# Patient Record
Sex: Male | Born: 1985 | Race: White | Hispanic: No | Marital: Married | Smoking: Current every day smoker
Health system: Southern US, Community
[De-identification: ages and names within clinical notes are randomized; demographics above are authoritative.]

---
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
## 2010-04-27 ENCOUNTER — Emergency Department (HOSPITAL_COMMUNITY): Admission: EM | Admit: 2010-04-27 | Discharge: 2010-04-27 | Payer: Self-pay | Admitting: Emergency Medicine

## 2011-01-05 NOTE — Discharge Summary (Signed)
Logan Lane, Logan Lane             ACCOUNT NO.:  [id number]   MEDICAL RECORD NO.:  [id number]          PATIENT TYPE:  INP   LOCATION:  6741                         FACILITY:  [facility]   PHYSICIAN:  Ileana Roup, M.D.  DATE OF BIRTH:  May 20, 1986   DATE OF ADMISSION:  01/01/2006  DATE OF DISCHARGE:  01/05/2006                                 DISCHARGE SUMMARY   DISCHARGE DIAGNOSES:  1.  Left lower lobe pneumonia with early sepsis.  2.  History of premature birth.  3.  Status post tonsillectomy and adenoidectomy.   DISCHARGE MEDICATIONS:  1.  Levaquin 750 mg p.o. daily last dose to be taken on Jan 14, 2006 for a      total treatment course of 2 weeks.  2.  Vicodin 5/500 one tablet p.o. q.4 h p.r.n. pleuritic chest pain.   CONDITION AT DISCHARGE:  The patient is stable at the time of discharge.  He  is without complaints of shortness of breath.  His oxygen saturations are  100% on room air.  He denies any continued pleuritic chest pain.  He is  afebrile.   FOLLOW-UP PLANS INCLUDE:  He will follow up with Dr. Orma Flaming in the  outpatient clinic next Friday, Jan 11, 2006.   PROCEDURES:  1.  His chest x-ray done on Jan 01, 2006 shows a left lower lobe pneumonia.  2.  CT of the abdomen and pelvis and chest done Jan 02, 2006 shows left      lower lobe pneumonia, no evidence for aortic dissection or pulmonary      emboli, fullness in the subcarinal region of the right hilum suspicious      for adenopathy.  There is no pathologic findings in the abdomen.  3.  Punch biopsy of the right anterior foot done on Jan 01, 2006 shows mild      orthokeratosis and mild epidermal hyperplasia.  There are occasional      neutrophils.  Impression:  Blue stain is negative for iron and the [facility] spotted fever immunoperoxidase stain is negative.  4.  Chest x-ray done on Jan 03, 2006 shows near complete clearing of the      left lower lobe infiltrate.  5.  Lumbar puncture done on Jan 01, 2006  shows a protein count of 28,      glucose of 75, no organisms seen on Gram stain.  White blood cell count      was 1 and red blood cell count was 221 in tube #1.  White blood cell      count was 3 and red blood cell count was 16 in tube #4.   CONSULTATIONS:  There are no consultations during this admission.   BRIEF ADMISSION HISTORY AND PHYSICAL:  Logan Lane is an 25 year old  previously healthy white male with no significant past medical history.  He  presents with 1-day history of fevers to 101, headache, nausea, vomiting,  cough productive of green sputum and near syncope on the morning of  admission.  He had a prodrome of a bad cold for  the past month.  He did have  tick exposure about 1 week prior to admission as he was de-ticking a stray  dog manually.  He also has complaints of pleuritic chest pain.  Significant  findings on admission exam include a temperature of 101.7, blood pressure  127/74, pulse 92, respirations 22, he is 98% on room air.  In general, he is  a mild distress and slightly diaphoretic.  His neck was mildly stiff but  supple just hours later.  Cardiovascular exam showed a regular rate and  rhythm, no murmurs.  The lungs had decreased breath sounds in the left base.  His skin did have one area of papular erythematous rash on the left and  right anterior foot.  Cranial nerves were intact and strength was 5/5 in  bilateral upper and lower extremities.  Admission labs of significance were  a white blood cell count of 21.6 with an ANC of 19.9.  His ABG on admission  was 7.408 for a pH with a pCO2 of 41.6 and a pO2 of 85.0, bicarb of 26.1,  oxygen saturation of 96%.  His UDS was negative.  His UA was negative except  for ketones.  Point-care-markers were negative.  His respiratory culture did  show gram-positive cocci in clusters.  His strep pneumo antigen was  negative.  His coagulopathies were normal.  Please see admission H&P for  full details.   HOSPITAL COURSE  BY PROBLEM:  Problem 1.  LEFT LOWER LOBE PNEUMONIA.  Mr.  Lane came in with complaints of a prodrome of a month of a bad cold that  progressed to a productive cough, high fevers, and near syncope.  His  initial chest x-ray did show a left lower lobe pneumonia.  He did have  leukocytosis on admission and left-sided pleuritic chest pain all consistent  with a severe left lower lobe pneumonia.  He was immediately began on  vancomycin and Zosyn.  Again, his oxygen saturations were normal at the time  of admission.  He quickly began to defervesce within the first doses of his  antibiotics.  His course was complicated by early sepsis.  Please see below.  He did continue to improve on IV vancomycin and Zosyn.  His respiratory  cultures eventually grew Streptococcus  pneumoniae and this was sensitive to  levofloxacin.  And therefore, he was changed to p.o. antibiotics.  He was  afebrile for greater than 48 hours prior to discharge.  His white blood cell  count did return to normal.  His pleuritic chest pain did resolve.  Again on  the day of discharge his oxygen saturations are 98% on room air.  His last  white blood cell count is 7.2 on Jan 04, 2006.  He is breathing easily and a  repeat chest x-ray did show near complete resolution of his left lower lobe  pneumonia.  He is felt stable for discharge and is being discharged on  Levaquin for a total treatment course of 2 weeks given his severity of his  pneumonia.  He is also being given Vicodin for any further pleuritic chest  pain.  Of note many other diagnoses were considered at the time of his  admission.  He did have tick exposure and therefore he was started on  doxycycline.  A punch biopsy of erythematous papular rash on his foot was  negative for [facility] spotted fever.  He also had headache at the time  of admission with high fevers and questionable neck  stiffness and a lumbar puncture was performed.  The results were completely  normal.  Protein and  glucose levels were normal and the white blood cell count was normal.  This  was not felt to be a meningitis.  Of note, he did have a reaction to the  vancomycin consistent with red man syndrome.  Secondary to his need for this  antibiotic the vancomycin was continued but run at a much slower rate and he  was pretreated with Benadryl prior to each treatment.  He had no further  symptoms of red man syndrome with subsequent vancomycin doses run slowly  pretreated with Benadryl.   Problem 2. EARLY SEPSIS.  His blood cultures drawn at the time of admission  one of the two did show gram-positive cocci in clusters later identified to  be strep pneumo.  Again, he was treated with vancomycin and Zosyn until  these cultures and identities were back.  He did become hypotensive on the  night of admission to systolic's in the 80s.  His blood pressure did respond  to IV fluid boluses.  His CVP was followed closely and he was treated with  IV fluids.  An ACTH stim test was performed and did show a bump in his  cortisol level from 7.7 to 24.  However the test was not performed entirely  probably as the post ACTH administration cortisol was drawn 2-1/2 hours  after administration of ACTH.  Although this was deemed to be a positive  test his blood pressure was stable for 24 hours before the results were  received and it was not felt that he needed any stress dose steroids.  He  had no further episodes of hypotension through the rest of his  hospitalization.  On the day of discharge his blood pressure was actually  143/81.  Again, he will be discharged on Levaquin for a total course of 2  weeks given the severity of his illness.   Problem 3. TOOTH PAIN.  On the day of discharge the patient admitted to some  left jaw/upper tooth pain.  He said he felt that it may be related to some  wisdom teeth.  Examination did not reveal any evidence of abscess or dental  caries.  Again, he is  afebrile and his white blood cell count was normal.  He was advised to see a dentist for possible Panorex films.  Levaquin should  cover any sinusitis component of this pain.  He was advised that if he were  to have any fevers or continued severe pain in this area he should call  immediately.  Although it would be advisable for him to see a dentist soon  regarding this, I doubt any intervention would be done while he is  recovering from the severe pneumonia.   FOLLOW UP:  He will follow with me in 1 week.  Until that time he should  stay out of work and increase his activity slowly.   DISCHARGE LABORATORIES AND VITALS:  Vital signs on the day of discharge  include a temperature of 97.1, blood pressure 143/81, pulse of 67,  respirations 20, 98% on room air.  His last set of labs include a white  blood cell count of 7.2, hemoglobin 14.1, platelets of 239.  His sodium was 141, potassium 3.9, chloride 106, bicarb 30, BUN 3, creatinine 0.9, glucose  of 94, calcium 8.9.  Other significant labs include a lumbar puncture with  protein of 28, glucose of 75, tube #1  white blood cells 1 and red blood  cells 221, tube #4 white blood cells 3 and red blood cells 16.  HIV was  nonreactive.  Respiratory cultures for strep pneumo sensitive to Levaquin.  Blood cultures one of two grew strep pneumo sensitive to Levaquin.  His CSF  culture showed no growth.  His random cortisol was 7.3.  His post ACTH stem  cortisol at 2-1/2 hours was 23.6 and at 3 hours was 30.9.  Pending labs at  the time of this dictation include enterovirus and HSV PCR on his CSF.  Final readings on his CSF culture and blood cultures.      Inis Sizer, M.D.    ______________________________  Ileana Roup, M.D.    DC/MEDQ  D:  01/05/2006  T:  01/05/2006  Job:  [id number]   cc:   Outpatient Clinic

## 2011-08-25 ENCOUNTER — Emergency Department (HOSPITAL_COMMUNITY)
Admission: EM | Admit: 2011-08-25 | Discharge: 2011-08-25 | Disposition: A | Discharge status: Home / Self Care | Payer: 59 | Attending: Emergency Medicine | Admitting: Emergency Medicine

## 2011-08-25 ENCOUNTER — Emergency Department (HOSPITAL_COMMUNITY): Payer: 59

## 2011-08-25 ENCOUNTER — Encounter: Payer: Self-pay | Admitting: *Deleted

## 2011-08-25 DIAGNOSIS — Y990 Civilian activity done for income or pay: Secondary | ICD-10-CM | POA: Insufficient documentation

## 2011-08-25 DIAGNOSIS — M79609 Pain in unspecified limb: Secondary | ICD-10-CM | POA: Insufficient documentation

## 2011-08-25 DIAGNOSIS — IMO0002 Reserved for concepts with insufficient information to code with codable children: Secondary | ICD-10-CM | POA: Insufficient documentation

## 2011-08-25 DIAGNOSIS — R209 Unspecified disturbances of skin sensation: Secondary | ICD-10-CM | POA: Insufficient documentation

## 2011-08-25 DIAGNOSIS — S62329A Displaced fracture of shaft of unspecified metacarpal bone, initial encounter for closed fracture: Secondary | ICD-10-CM | POA: Insufficient documentation

## 2011-08-25 DIAGNOSIS — S6990XA Unspecified injury of unspecified wrist, hand and finger(s), initial encounter: Secondary | ICD-10-CM | POA: Insufficient documentation

## 2011-08-25 DIAGNOSIS — M7989 Other specified soft tissue disorders: Secondary | ICD-10-CM | POA: Insufficient documentation

## 2011-08-25 DIAGNOSIS — R29898 Other symptoms and signs involving the musculoskeletal system: Secondary | ICD-10-CM | POA: Insufficient documentation

## 2011-08-25 MED ORDER — HYDROCODONE-ACETAMINOPHEN 5-325 MG PO TABS: 2.0000 | ORAL_TABLET | ORAL | Status: AC | PRN

## 2011-08-25 NOTE — ED Provider Notes (Signed)
History     CSN: [id number]  Arrival date & time 08/25/11  1505   First MD Initiated Contact with Patient 08/25/11 1847      Chief Complaint  Patient presents with  . 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 He has no wrist injury and no pain to the rest of his right arm. This localized sharp severe tender pain without radiation or associated symptoms other than the tingling diffusely to the hand with no severe swelling. He is able to flex and extend all digits of his right hand with nearly full extension but not quite full flexion due to the pain at the fourth metacarpal. History reviewed. No pertinent past medical history.  History reviewed. No pertinent past surgical history.  History reviewed. No pertinent family history.  History  Substance Use Topics  . Smoking status: Current Everyday Smoker -- 0.5 packs/day    Types: Cigarettes  . Smokeless tobacco: Not on file  . Alcohol Use: Yes      Review of Systems  Constitutional: Negative for fever.       10 Systems reviewed and are negative for acute change except as noted in the HPI.  HENT: Negative for congestion.   Eyes: Negative for discharge and redness.  Respiratory: Negative for cough and shortness of breath.   Cardiovascular: Negative for chest pain.  Gastrointestinal: Negative for vomiting and abdominal  pain.  Musculoskeletal: Negative for back pain.  Skin: Negative for rash.  Neurological: Negative for syncope, numbness and headaches.  Psychiatric/Behavioral:       No behavior change.    Allergies  Review of patient's allergies indicates no known allergies.  Home Medications   Current Outpatient Rx  Name Route Sig Dispense Refill  . HYDROCODONE-ACETAMINOPHEN 5-325 MG PO TABS Oral Take 2 tablets by mouth every 4 (four) hours as needed for pain. 20 tablet 0    BP 119/71  Pulse 62  Temp(Src) 98.2 F (36.8 C) (Oral)  Resp 18  SpO2 97%  Physical Exam  Nursing note and vitals reviewed. Constitutional:       Awake, alert, nontoxic appearance.  HENT:  Head: Atraumatic.  Eyes: Right eye exhibits no discharge. Left eye exhibits no discharge.  Neck: Neck supple.  Pulmonary/Chest: Effort normal. He exhibits no tenderness.  Abdominal: Soft. There is no tenderness. There is no rebound.  Musculoskeletal: He exhibits tenderness.       Baseline ROM, no obvious new focal weakness.  Left arm and both legs are nontender the right arm is nontender the shoulder elbow and wrist including the snuff box. The patient has isolated tenderness to the right hand over the fourth metacarpal region only. There is no obvious rotational defect to his hand he has capillary refill less than 2 seconds in all digits of the right hand, he is normal light touch in all digits to the right hand, he has nearly full extension  of all digits but decreased flexion of all digits due to the pain with mild swelling and moderate tenderness over the fourth metacarpal region only with the first second third and fifth metacarpals nontender to direct palpation as well as axial load. The skin is intact the right hand. I do not clinically suspect compartment syndrome. He is able to flex and extend the ring finger just not to full range of motion.  Neurological:       Mental status and motor strength appears baseline for patient and  situation.  Skin: No rash noted.  Psychiatric: He has a normal mood and affect.    ED Course  Procedures (including critical care time) Clinically I suspect a fracture to the fourth metacarpal shaft only. Labs Reviewed - No data to display Dg Hand Complete Right  08/25/2011  *RADIOLOGY REPORT*  Clinical Data: Left hand pain/injury  RIGHT HAND - COMPLETE 3+ VIEW  Comparison: None.  Findings: Nondisplaced fracture of the fourth mid metacarpal shaft.  The joint spaces are preserved.  Mild dorsal soft tissue swelling.  IMPRESSION: Nondisplaced fracture of the fourth mid metacarpal shaft.  Original Report Authenticated By: Charline Bills, M.D.     1. Fracture, metacarpal shaft       MDM          Hurman Horn, MD 08/25/11 2300

## 2011-08-25 NOTE — Progress Notes (Signed)
Orthopedic Tech Progress Note Patient Details:  Logan Lane [birth year] [id number]  Type of Splint: Other (comment) (ulna gutter) Splint Location: right hand Splint Interventions: Application    Nikki Dom 08/25/2011, 7:22 PM

## 2011-08-25 NOTE — ED Notes (Signed)
Patient with right hand injury from yesterday.  Right hans is slightly swollen and painful.  CMS intact, right index states unable to feel

## 2013-07-20 ENCOUNTER — Emergency Department (HOSPITAL_COMMUNITY): Payer: 59

## 2013-07-20 ENCOUNTER — Encounter (HOSPITAL_COMMUNITY): Payer: Self-pay | Admitting: Emergency Medicine

## 2013-07-20 DIAGNOSIS — S92919A Unspecified fracture of unspecified toe(s), initial encounter for closed fracture: Secondary | ICD-10-CM | POA: Insufficient documentation

## 2013-07-20 DIAGNOSIS — X58XXXA Exposure to other specified factors, initial encounter: Secondary | ICD-10-CM | POA: Insufficient documentation

## 2013-07-20 DIAGNOSIS — Y939 Activity, unspecified: Secondary | ICD-10-CM | POA: Insufficient documentation

## 2013-07-20 DIAGNOSIS — F17200 Nicotine dependence, unspecified, uncomplicated: Secondary | ICD-10-CM | POA: Insufficient documentation

## 2013-07-20 DIAGNOSIS — Y929 Unspecified place or not applicable: Secondary | ICD-10-CM | POA: Insufficient documentation

## 2013-07-20 NOTE — ED Notes (Signed)
Pt c/o rt foot pain, but denies any injury.

## 2013-07-21 ENCOUNTER — Emergency Department (HOSPITAL_COMMUNITY)
Admission: EM | Admit: 2013-07-21 | Discharge: 2013-07-21 | Disposition: A | Discharge status: Home / Self Care | Payer: 59 | Attending: Emergency Medicine | Admitting: Emergency Medicine

## 2013-07-21 DIAGNOSIS — S92411A Displaced fracture of proximal phalanx of right great toe, initial encounter for closed fracture: Secondary | ICD-10-CM

## 2013-07-21 MED ORDER — IBUPROFEN 800 MG PO TABS
800.0000 mg | ORAL_TABLET | Freq: Once | ORAL | Status: AC
  Administered 2013-07-21: 800 mg via ORAL
  Filled 2013-07-21: qty 1

## 2013-07-21 MED ORDER — OXYCODONE-ACETAMINOPHEN 5-325 MG PO TABS: 1.0000 | ORAL_TABLET | ORAL | Status: AC | PRN

## 2013-07-21 NOTE — ED Provider Notes (Signed)
CSN: [id number]     Arrival date & time 07/20/13  2310 History   First MD Initiated Contact with Patient 07/21/13 0243     Chief Complaint  Patient presents with  . Foot Pain   (Consider location/radiation/quality/duration/timing/severity/associated sxs/prior Treatment) Patient is a 27 y.o. male presenting with lower extremity pain. The history is provided by the patient and the spouse.  Foot Pain  He noticed onset this evening of pain and swelling of the right first toe. Pain is moderate to severe and he rates it at 8/10. It is worse with palpation and with ambulation. He denies any history of trauma. However, his wife states that he dropped a total bottom of his foot last night. Nothing makes it any better. He has not taken any medication.  History reviewed. No pertinent past medical history. History reviewed. No pertinent past surgical history. No family history on file. History  Substance Use Topics  . Smoking status: Current Every Day Smoker -- 0.50 packs/day    Types: Cigarettes  . Smokeless tobacco: Not on file  . Alcohol Use: Yes    Review of Systems  All other systems reviewed and are negative.    Allergies  Review of patient's allergies indicates no known allergies.  Home Medications   Current Outpatient Rx  Name  Route  Sig  Dispense  Refill  . oxyCODONE-acetaminophen (PERCOCET/ROXICET) 5-325 MG per tablet   Oral   Take 1 tablet by mouth every 4 (four) hours as needed for severe pain.   20 tablet   0    BP 136/74  Pulse 70  Temp(Src) 98.3 F (36.8 C) (Oral)  Resp 24  Ht 6' (1.829 m)  Wt 199 lb (90.266 kg)  BMI 26.98 kg/m2  SpO2 100% Physical Exam  Nursing note and vitals reviewed.  27 year old male, resting comfortably and in no acute distress. Vital signs are significant for tachypnea with respiratory rate of 24. Oxygen saturation is 100%, which is normal. Head is normocephalic and atraumatic. PERRLA, EOMI. Oropharynx is clear. Neck is nontender  and supple without adenopathy or JVD. Back is nontender and there is no CVA tenderness. Lungs are clear without rales, wheezes, or rhonchi. Chest is nontender. Heart has regular rate and rhythm without murmur. Abdomen is soft, flat, nontender without masses or hepatosplenomegaly and peristalsis is normoactive. Extremities:  There is soft tissue swelling and ecchymosis of the proximal phalanx of the right first toe. There is moderate tenderness palpation. Distal neurovascular exam is intact with normal sensation and prompt capillary refill. Remainder of extremity exam is normal. Skin is warm and dry without rash. Neurologic: Mental status is normal, cranial nerves are intact, there are no motor or sensory deficits.  ED Course  Procedures (including critical care time) Imaging Review Dg Foot Complete Right  07/20/2013   CLINICAL DATA:  Right foot pain after working in yd involving great toe and 2nd toe, patient denies injury  EXAM: RIGHT FOOT COMPLETE - 3+ VIEW  COMPARISON:  None.  FINDINGS: There is a mildly displaced oblique fracture involving the 1st proximal phalanx. Fracture line does not appear to extend into the metatarsophalangeal joint or the interphalangeal joint. No fractures identified involving the 2nd toe.  IMPRESSION: Fracture of the 1st proximal phalanx, mildly displaced   Electronically Signed   By: Esperanza Heir M.D.   On: 07/20/2013 23:41   Images viewed by me.  MDM   1. Closed displaced fracture of proximal phalanx of right great toe, initial encounter  Fracture of the right first toe proximal phalanx. He is placed in a postop shoe and given crutches and referred to orthopedics for followup. Prescription is given for acetaminophen-oxycodone for pain. He sees ibuprofen or acetaminophen for less severe pain.    Dione Booze, MD 07/21/13 [phone]

## 2014-12-30 ENCOUNTER — Emergency Department (HOSPITAL_COMMUNITY): Payer: Self-pay

## 2014-12-30 ENCOUNTER — Emergency Department (HOSPITAL_COMMUNITY)
Admission: EM | Admit: 2014-12-30 | Discharge: 2014-12-30 | Disposition: A | Discharge status: Home / Self Care | Payer: Self-pay | Attending: Emergency Medicine | Admitting: Emergency Medicine

## 2014-12-30 ENCOUNTER — Encounter (HOSPITAL_COMMUNITY): Payer: Self-pay

## 2014-12-30 DIAGNOSIS — J3489 Other specified disorders of nose and nasal sinuses: Secondary | ICD-10-CM | POA: Insufficient documentation

## 2014-12-30 DIAGNOSIS — R0981 Nasal congestion: Secondary | ICD-10-CM | POA: Insufficient documentation

## 2014-12-30 DIAGNOSIS — M791 Myalgia: Secondary | ICD-10-CM | POA: Insufficient documentation

## 2014-12-30 DIAGNOSIS — R6889 Other general symptoms and signs: Secondary | ICD-10-CM

## 2014-12-30 DIAGNOSIS — R509 Fever, unspecified: Secondary | ICD-10-CM | POA: Insufficient documentation

## 2014-12-30 DIAGNOSIS — J029 Acute pharyngitis, unspecified: Secondary | ICD-10-CM | POA: Insufficient documentation

## 2014-12-30 DIAGNOSIS — Z720 Tobacco use: Secondary | ICD-10-CM | POA: Insufficient documentation

## 2014-12-30 DIAGNOSIS — R51 Headache: Secondary | ICD-10-CM | POA: Insufficient documentation

## 2014-12-30 LAB — RAPID STREP SCREEN (MED CTR MEBANE ONLY): STREPTOCOCCUS, GROUP A SCREEN (DIRECT): NEGATIVE

## 2014-12-30 MED ORDER — GUAIFENESIN-CODEINE 100-10 MG/5ML PO SYRP: 5.0000 mL | ORAL_SOLUTION | Freq: Three times a day (TID) | ORAL | Status: AC | PRN

## 2014-12-30 MED ORDER — BENZONATATE 200 MG PO CAPS
200.0000 mg | ORAL_CAPSULE | Freq: Three times a day (TID) | ORAL | Status: AC | PRN
Start: 2014-12-30 — End: ?

## 2014-12-30 MED ORDER — HYDROCOD POLST-CPM POLST ER 10-8 MG/5ML PO SUER
5.0000 mL | Freq: Once | ORAL | Status: AC
  Administered 2014-12-30: 5 mL via ORAL
  Filled 2014-12-30: qty 5

## 2014-12-30 NOTE — Discharge Instructions (Signed)
Your strep screen is negative and your chest x-ray is normal. We are treating your flu like symptoms. Stay in bed, drink plenty of fluids. Return for worsening symptoms

## 2014-12-30 NOTE — ED Notes (Signed)
Pt verbalized understanding of no driving and to use caution within 4 hours of taking pain meds due to meds cause drowsiness 

## 2014-12-30 NOTE — ED Provider Notes (Signed)
CSN: [id number]     Arrival date & time 12/30/14  2124 History   First MD Initiated Contact with Patient 12/30/14 2135     Chief Complaint  Patient presents with  . Fever     (Consider location/radiation/quality/duration/timing/severity/associated sxs/prior Treatment) Patient is a 29 y.o. male presenting with fever. The history is provided by the patient.  Fever Max temp prior to arrival:  102 Temp source:  Oral Severity:  Moderate Onset quality:  Gradual Duration:  2 days Timing:  Intermittent Progression:  Unchanged Chronicity:  New Relieved by:  Acetaminophen and ibuprofen Worsened by:  Nothing tried Associated symptoms: chills, congestion, cough, headaches, myalgias, rhinorrhea and sore throat    Logan Lane is a 29 y.o. male who presents to the ED with fever, chills, sore throat and headache that started 2 days.   History reviewed. No pertinent past medical history. History reviewed. No pertinent past surgical history. No family history on file. History  Substance Use Topics  . Smoking status: Current Every Day Smoker -- 0.50 packs/day    Types: Cigarettes  . Smokeless tobacco: Not on file  . Alcohol Use: Yes    Review of Systems  Constitutional: Positive for fever and chills.  HENT: Positive for congestion, rhinorrhea and sore throat.   Respiratory: Positive for cough.   Musculoskeletal: Positive for myalgias.  Neurological: Positive for headaches.  all other systems negative    Allergies  Review of patient's allergies indicates no known allergies.  Home Medications   Prior to Admission medications   Medication Sig Start Date End Date Taking? Authorizing Provider  acetaminophen (TYLENOL) 500 MG tablet Take 1,000 mg by mouth every 6 (six) hours as needed for mild pain or moderate pain.   Yes Historical Provider, MD  ibuprofen (ADVIL,MOTRIN) 200 MG tablet Take 400 mg by mouth every 6 (six) hours as needed for mild pain or moderate pain.   Yes  Historical Provider, MD  benzonatate (TESSALON) 200 MG capsule Take 1 capsule (200 mg total) by mouth 3 (three) times daily as needed for cough. 12/30/14   Layce Sprung Orlene OchM Brantley Naser, NP  guaiFENesin-codeine (ROBITUSSIN AC) 100-10 MG/5ML syrup Take 5 mLs by mouth 3 (three) times daily as needed for cough. 12/30/14   Richad Ramsay Orlene OchM Fatime Biswell, NP  oxyCODONE-acetaminophen (PERCOCET/ROXICET) 5-325 MG per tablet Take 1 tablet by mouth every 4 (four) hours as needed for severe pain. Patient not taking: Reported on 12/30/2014 07/21/13   Dione Boozeavid Glick, MD   BP 113/82 mmHg  Pulse 77  Temp(Src) 99.4 F (37.4 C) (Oral)  Resp 20  Ht 6' (1.829 m)  Wt 189 lb 9 oz (85.985 kg)  BMI 25.70 kg/m2  SpO2 100% Physical Exam  Constitutional: He is oriented to person, place, and time. He appears well-developed and well-nourished. No distress.  HENT:  Head: Normocephalic.  Right Ear: Tympanic membrane normal.  Left Ear: Tympanic membrane normal.  Nose: Rhinorrhea present.  Mouth/Throat: Uvula is midline and mucous membranes are normal. Posterior oropharyngeal erythema present.  Eyes: Conjunctivae and EOM are normal. Pupils are equal, round, and reactive to light.  Neck: Neck supple.  Cardiovascular: Normal rate and regular rhythm.   Pulmonary/Chest: Effort normal. No respiratory distress. Wheezes: occasional. He has no rales.  Abdominal: Soft. There is no tenderness.  Musculoskeletal: Normal range of motion.  Neurological: He is alert and oriented to person, place, and time. No cranial nerve deficit.  Skin: Skin is warm and dry.  Psychiatric: He has a normal mood and affect. His  behavior is normal.  Nursing note and vitals reviewed.   ED Course  Procedures (including critical care time) Labs Review Results for orders placed or performed during the hospital encounter of 12/30/14 (from the past 24 hour(s))  Rapid strep screen     Status: None   Collection Time: 12/30/14  9:41 PM  Result Value Ref Range   Streptococcus, Group A  Screen (Direct) NEGATIVE NEGATIVE    Imaging Review Dg Chest 2 View  12/30/2014   CLINICAL DATA:  Chest congestion, productive cough, fever  EXAM: CHEST  2 VIEW  COMPARISON:  01/03/2006  FINDINGS: Cardiomediastinal silhouette is stable. No acute infiltrate or pleural effusion. No pulmonary edema. Mild hyperinflation.  IMPRESSION: No active disease.  Mild hyperinflation.   Electronically Signed   By: Natasha MeadLiviu  Pop M.D.   On: 12/30/2014 22:30     MDM  SUBJECTIVE:  Logan Lane is a 29 y.o. male who present complaining of flu-like symptoms: fevers, chills, myalgias, congestion, sore throat and cough for 2 days. Denies dyspnea or wheezing.  OBJECTIVE: Appears moderately ill but not toxic; temperature as noted in vitals. Ears normal. Throat and pharynx with erythema but negative strep screen.  Neck supple. No adenopathy in the neck. Sinuses non tender. The chest is clear.  ASSESSMENT: Flu like symptoms  PLAN: Symptomatic therapy suggested: rest, increase fluids, gargle prn for sore throat, use mist of vaporizer prn and call prn if symptoms persist or worsen. Return if these symptoms worsen or fail to improve as anticipated.  Final diagnoses:  Flu-like symptoms      Janne NapoleonHope M Layna Roeper, NP 12/30/14 [id number]  Geoffery Lyonsouglas Delo, MD 01/02/15 [phone]

## 2014-12-30 NOTE — ED Notes (Signed)
He has been running a fever of 102; been taking taking tylenol and ibuprofen for it. Patient states that he has a sore throat and body aches. He has also had headaches and dizziness for the past 2 days.

## 2015-01-02 LAB — CULTURE, GROUP A STREP: STREP A CULTURE: NEGATIVE

## 2015-04-26 ENCOUNTER — Emergency Department (HOSPITAL_COMMUNITY)
Admission: EM | Admit: 2015-04-26 | Discharge: 2015-04-26 | Discharge status: Against Medical Advice | Payer: Self-pay | Attending: Emergency Medicine | Admitting: Emergency Medicine

## 2015-04-26 DIAGNOSIS — Z0289 Encounter for other administrative examinations: Secondary | ICD-10-CM | POA: Insufficient documentation

## 2015-04-26 DIAGNOSIS — Z720 Tobacco use: Secondary | ICD-10-CM | POA: Insufficient documentation
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
## 2016-06-25 ENCOUNTER — Encounter (HOSPITAL_COMMUNITY): Payer: Self-pay | Admitting: Emergency Medicine

## 2016-06-25 DIAGNOSIS — G44319 Acute post-traumatic headache, not intractable: Secondary | ICD-10-CM | POA: Insufficient documentation

## 2016-06-25 DIAGNOSIS — F17210 Nicotine dependence, cigarettes, uncomplicated: Secondary | ICD-10-CM | POA: Insufficient documentation

## 2016-06-25 DIAGNOSIS — Y9389 Activity, other specified: Secondary | ICD-10-CM | POA: Insufficient documentation

## 2016-06-25 DIAGNOSIS — W228XXA Striking against or struck by other objects, initial encounter: Secondary | ICD-10-CM | POA: Insufficient documentation

## 2016-06-25 DIAGNOSIS — Y999 Unspecified external cause status: Secondary | ICD-10-CM | POA: Insufficient documentation

## 2016-06-25 DIAGNOSIS — Y929 Unspecified place or not applicable: Secondary | ICD-10-CM | POA: Insufficient documentation
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
## 2016-06-26 ENCOUNTER — Emergency Department (HOSPITAL_COMMUNITY): Payer: Self-pay

## 2016-06-26 ENCOUNTER — Emergency Department (HOSPITAL_COMMUNITY)
Admission: EM | Admit: 2016-06-26 | Discharge: 2016-06-26 | Disposition: A | Discharge status: Home / Self Care | Payer: Self-pay | Attending: Emergency Medicine | Admitting: Emergency Medicine

## 2016-06-26 DIAGNOSIS — G44319 Acute post-traumatic headache, not intractable: Secondary | ICD-10-CM

## 2016-06-26 MED ORDER — PROMETHAZINE HCL 25 MG PO TABS: 25.0000 mg | ORAL_TABLET | Freq: Four times a day (QID) | ORAL | 0 refills | Status: AC | PRN
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
## 2018-02-28 ENCOUNTER — Encounter (HOSPITAL_COMMUNITY): Payer: Self-pay | Admitting: Emergency Medicine

## 2018-02-28 ENCOUNTER — Emergency Department (HOSPITAL_COMMUNITY)
Admission: EM | Admit: 2018-02-28 | Discharge: 2018-02-28 | Disposition: A | Discharge status: Home / Self Care | Payer: PRIVATE HEALTH INSURANCE | Attending: Emergency Medicine | Admitting: Emergency Medicine

## 2018-02-28 ENCOUNTER — Other Ambulatory Visit: Payer: Self-pay

## 2018-02-28 DIAGNOSIS — Y999 Unspecified external cause status: Secondary | ICD-10-CM | POA: Insufficient documentation

## 2018-02-28 DIAGNOSIS — W268XXA Contact with other sharp object(s), not elsewhere classified, initial encounter: Secondary | ICD-10-CM | POA: Diagnosis not present

## 2018-02-28 DIAGNOSIS — Y939 Activity, unspecified: Secondary | ICD-10-CM | POA: Diagnosis not present

## 2018-02-28 DIAGNOSIS — F17210 Nicotine dependence, cigarettes, uncomplicated: Secondary | ICD-10-CM | POA: Insufficient documentation

## 2018-02-28 DIAGNOSIS — S91312A Laceration without foreign body, left foot, initial encounter: Secondary | ICD-10-CM | POA: Diagnosis not present

## 2018-02-28 DIAGNOSIS — Y929 Unspecified place or not applicable: Secondary | ICD-10-CM | POA: Diagnosis not present

## 2018-02-28 DIAGNOSIS — S99922A Unspecified injury of left foot, initial encounter: Secondary | ICD-10-CM | POA: Diagnosis present

## 2018-02-28 MED ORDER — POVIDONE-IODINE 10 % EX SOLN
CUTANEOUS | Status: AC
  Filled 2018-02-28: qty 15

## 2018-02-28 MED ORDER — LIDOCAINE HCL (PF) 2 % IJ SOLN
INTRAMUSCULAR | Status: AC
  Filled 2018-02-28: qty 10

## 2018-02-28 NOTE — ED Triage Notes (Signed)
Pt c/o left foot laceration from a metal screen door x 20 mins, bleeding controlled at this time

## 2018-02-28 NOTE — ED Provider Notes (Signed)
[facility] Provider Note   CSN: [id number] Arrival date & time: 02/28/18  2040     History   Chief Complaint Chief Complaint  Patient presents with  . Laceration    HPI Logan Lane is a 32 y.o. male.  HPI  Logan Fusidward J Canino is a 32 y.o. male who presents to the Emergency Department complaining of laceration to his left foot that occurred shortly before ER arrival.  States he cut his foot on the metal edge of a screen door.  He denies pain, swelling and numbness of the foot.  He has not cleaned the wound.  Last Td is up to date.  Nothing makes his symptoms better or worse.    History reviewed. No pertinent past medical history.  There are no active problems to display for this patient.   History reviewed. No pertinent surgical history.    Home Medications    Prior to Admission medications   Medication Sig Start Date End Date Taking? Authorizing Provider  acetaminophen (TYLENOL) 500 MG tablet Take 1,000 mg by mouth every 6 (six) hours as needed for mild pain or moderate pain.    Provider, Historical, Lane  benzonatate (TESSALON) 200 MG capsule Take 1 capsule (200 mg total) by mouth 3 (three) times daily as needed for cough. 12/30/14   Logan Lane, Logan Lane  guaiFENesin-codeine [facility](ROBITUSSIN AC) 100-10 MG/5ML syrup Take 5 mLs by mouth 3 (three) times daily as needed for cough. 12/30/14   Logan Lane, Logan Lane  ibuprofen (ADVIL,MOTRIN) 200 MG tablet Take 400 mg by mouth every 6 (six) hours as needed for mild pain or moderate pain.    Provider, Historical, Lane  oxyCODONE-acetaminophen (PERCOCET/ROXICET) 5-325 MG per tablet Take 1 tablet by mouth every 4 (four) hours as needed for severe pain. Patient not taking: Reported on 12/30/2014 07/21/13   Logan Lane, Logan Lane  promethazine (PHENERGAN) 25 MG tablet Take 1 tablet (25 mg total) by mouth every 6 (six) hours as needed (headache). 06/26/16   Logan Lane, Logan Lane    Family History History reviewed. No pertinent  family history.  Social History Social History   Tobacco Use  . Smoking status: Current Every Day Smoker    Packs/day: 0.50    Types: Cigarettes  . Smokeless tobacco: Never Used  Substance Use Topics  . Alcohol use: Not Currently  . Drug use: No     Allergies   Patient has no known allergies.   Review of Systems Review of Systems  Constitutional: Negative for chills and fever.  Musculoskeletal: Negative for arthralgias and joint swelling.  Skin: Positive for wound.       Laceration foot  Neurological: Negative for dizziness, weakness and numbness.  Hematological: Does not bruise/bleed easily.  All other systems reviewed and are negative.    Physical Exam Updated Vital Signs BP 127/88 (BP Location: Right Arm)   Pulse 95   Temp 98.2 F (36.8 C) (Oral)   Resp 12   Ht 6' (1.829 m)   Wt 85.3 kg (188 lb)   SpO2 99%   BMI 25.50 kg/m   Physical Exam  Constitutional: He appears well-nourished. No distress.  HENT:  Head: Atraumatic.  Cardiovascular: Normal rate, regular rhythm and intact distal pulses.  Pulmonary/Chest: Effort normal and breath sounds normal.  Musculoskeletal: Normal range of motion. He exhibits no edema or tenderness.  Neurological: He is alert. No sensory deficit.  Skin: Skin is warm. Capillary refill takes less than 2 seconds.  4  cm laceration to the lateral left foot.  Bleeding controlled.  No edema.   Nursing note and vitals reviewed.    ED Treatments / Results  Labs (all labs ordered are listed, but only abnormal results are displayed) Labs Reviewed - No data to display  EKG None  Radiology No results found.  Procedures Procedures (including critical care time)  LACERATION REPAIR Performed by: Jiovanni Heeter L. Authorized by: Maxwell Caul Consent: Verbal consent obtained. Risks and benefits: risks, benefits and alternatives were discussed Consent given by: patient Patient identity confirmed: provided demographic  data Prepped and Draped in normal sterile fashion Wound explored  Laceration Location: dorsal left foot  Laceration Length: 4 cm  No Foreign Bodies seen or palpated  Anesthesia: local infiltration  Local anesthetic: lidocaine 2 % w/o epinephrine  Anesthetic total: 3ml  Irrigation method: syringe  Amount of cleaning: standard  Skin closure: 4-0 ethilon  Number of sutures: 7  Technique: simple interrupted  Patient tolerance: Patient tolerated the procedure well with no immediate complications.   Medications Ordered in ED Medications  povidone-iodine (BETADINE) 10 % external solution (has no administration in time range)  lidocaine (XYLOCAINE) 2 % injection (has no administration in time range)     Initial Impression / Assessment and Plan / ED Course  I have reviewed the triage vital signs and the nursing notes.  Pertinent labs & imaging results that were available during my care of the patient were reviewed by me and considered in my medical decision making (see chart for details).     Td is up to date.  NV intact.  No FB's.   Wound bandaged.  Wound care instructions discussed.  Sutures out in 8-10 days.     Final Clinical Impressions(s) / ED Diagnoses   Final diagnoses:  Laceration of left foot, initial encounter    ED Discharge Orders    None       Pauline Aus, PA-C 03/02/18 1839    Samuel Jester, DO 03/05/18 [id number]

## 2018-02-28 NOTE — Discharge Instructions (Addendum)
Clean the wound with mild soap and water only.  Keep the area bandaged as needed.  Stitches out in 8 to 10 days.  Return to the ER for any signs of infection such as redness, swelling, increasing pain or red streaks

## 2018-02-28 NOTE — ED Notes (Signed)
PA in to suture

## 2019-05-06 ENCOUNTER — Other Ambulatory Visit: Payer: Self-pay

## 2019-05-06 DIAGNOSIS — U071 COVID-19: Secondary | ICD-10-CM

## 2019-05-07 LAB — NOVEL CORONAVIRUS, NAA: SARS-CoV-2, NAA: NOT DETECTED

## 2019-11-29 ENCOUNTER — Emergency Department (HOSPITAL_COMMUNITY): Payer: PRIVATE HEALTH INSURANCE

## 2019-11-29 ENCOUNTER — Encounter (HOSPITAL_COMMUNITY): Payer: Self-pay | Admitting: Emergency Medicine

## 2019-11-29 ENCOUNTER — Emergency Department (HOSPITAL_COMMUNITY)
Admission: EM | Admit: 2019-11-29 | Discharge: 2019-11-29 | Disposition: A | Discharge status: Home / Self Care | Payer: PRIVATE HEALTH INSURANCE | Attending: Emergency Medicine | Admitting: Emergency Medicine

## 2019-11-29 ENCOUNTER — Other Ambulatory Visit: Payer: Self-pay

## 2019-11-29 DIAGNOSIS — W208XXA Other cause of strike by thrown, projected or falling object, initial encounter: Secondary | ICD-10-CM | POA: Diagnosis not present

## 2019-11-29 DIAGNOSIS — Y999 Unspecified external cause status: Secondary | ICD-10-CM | POA: Insufficient documentation

## 2019-11-29 DIAGNOSIS — S0101XA Laceration without foreign body of scalp, initial encounter: Secondary | ICD-10-CM | POA: Diagnosis not present

## 2019-11-29 DIAGNOSIS — Y9389 Activity, other specified: Secondary | ICD-10-CM | POA: Diagnosis not present

## 2019-11-29 DIAGNOSIS — Y9289 Other specified places as the place of occurrence of the external cause: Secondary | ICD-10-CM | POA: Insufficient documentation

## 2019-11-29 DIAGNOSIS — S0990XA Unspecified injury of head, initial encounter: Secondary | ICD-10-CM | POA: Insufficient documentation

## 2019-11-29 DIAGNOSIS — F17210 Nicotine dependence, cigarettes, uncomplicated: Secondary | ICD-10-CM | POA: Diagnosis not present

## 2019-11-29 MED ORDER — LIDOCAINE-EPINEPHRINE (PF) 2 %-1:200000 IJ SOLN
10.0000 mL | Freq: Once | INTRAMUSCULAR | Status: DC
  Filled 2019-11-29 (×2): qty 10

## 2019-11-29 MED ORDER — ACETAMINOPHEN 325 MG PO TABS
650.0000 mg | ORAL_TABLET | Freq: Once | ORAL | Status: AC
Start: 2019-11-29 — End: 2019-11-29
  Administered 2019-11-29: 16:00:00 650 mg via ORAL
  Filled 2019-11-29: qty 2
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
## 2019-11-29 NOTE — Discharge Instructions (Addendum)
Please follow-up for suture removal at either urgent care ,the emergency department, or your primary care doctor in 5-7 days.   HEAD INJURY If any of the following occur notify your physician or go to the Hospital Emergency Department:  Increased drowsiness, stupor or loss of consciousness  Restlessness or convulsions (fits)  Paralysis in arms or legs  Temperature above 100 F  Vomiting  Severe headache  Blood or clear fluid dripping from the nose or ears  Stiffness of the neck  Dizziness or blurred vision  Pulsating pain in the eye  Unequal pupils of eye  Personality changes  Any other unusual symptoms PRECAUTIONS  Do not take tranquilizers, sedatives, narcotics or alcohol  Avoid aspirin. Use only acetaminophen (e.g. Tylenol) or ibuprofen (e.g. Advil) for relief of pain. Follow directions on the bottle for dosage.  Use ice packs for comfort  Getting plenty of rest and sleep helps the brain to heal. Do not try to do too much too fast. As you start to feel better, you can slowly and gradually return to your usual routine.  Avoid activities that are physically demanding (e.g., sports, heavy housecleaning, exercising) or require a lot of thinking or concentration (e.g., working on the computer, playing video games).  Ask your health care professional when you can safely drive a car, ride a bike, or operate heavy equipment. MEDICATIONS Use medications only as directed by your physician   Follow up with your primary care provider or with the Concussion Clinic within 5-7 days for re-evaluation.

## 2019-12-01 ENCOUNTER — Telehealth: Payer: Self-pay | Admitting: Family Medicine
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
## 2019-12-02 ENCOUNTER — Telehealth (INDEPENDENT_AMBULATORY_CARE_PROVIDER_SITE_OTHER): Payer: PRIVATE HEALTH INSURANCE | Admitting: Family Medicine

## 2019-12-02 ENCOUNTER — Encounter: Payer: Self-pay | Admitting: Family Medicine

## 2019-12-02 ENCOUNTER — Ambulatory Visit: Payer: PRIVATE HEALTH INSURANCE | Admitting: Family Medicine

## 2019-12-02 DIAGNOSIS — S0990XA Unspecified injury of head, initial encounter: Secondary | ICD-10-CM | POA: Insufficient documentation

## 2019-12-02 NOTE — Progress Notes (Addendum)
Subjective:   @VITALSMCOMMENTS @  Chief Complaint: Logan Lane, DOB: 1986/08/19, is a 34 y.o. male who presents for No chief complaint on file.   Injury date : November 29, 2019 Visit #: Visit 1  Previous imagine.  CT scan of the head showed no significant bony abnormality at the time of the emergency room.  Patient did have a laceration repair and that was 5 cm in length  History of Present Illness: Patient works for the [employer].  Patient was putting up a fence and a tool that weighed approximately 50 pounds flew off and hit him in the head.  Had a laceration on his head.  Went to the emergency room secondary to some dizziness and had a headache that seemed to resolve within an hour.  CT scan was done at that time that was independently visualized by me showing no significant skull fracture.  Patient doing relatively well overall.   Concussion Self-Reported Symptom Score Symptoms rated on a scale 1-6, in last 24 hours  Headache: 0  Nausea: 0  Vomiting: 0  Balance Difficulty: 0   Dizziness: 0  Fatigue: 0  Trouble Falling Asleep: 0   Sleep More Than Usual: 0  Sleep Less Than Usual: 0  Daytime Drowsiness: 0  Photophobia: 0  Phonophobia: 0  Feeling anxious: 0  Confused: 0  Irritability: 0  Sadness: 0  Nervousness: 0  Feeling More Emotional: 0  Numbness or Tingling: 0  Feeling Slowed Down: 0  Feeling Mentally Foggy: 0  Difficulty Concentrating: 0  Difficulty Remembering: 0  Visual Problems: 0  Neck Pain: 0  Tinnitus: 0   Total Symptom Score: 0    Review of Systems:  No , visual changes, nausea, vomiting, diarrhea, constipation, dizziness, abdominal pain, skin rash, fevers, chills, night sweats, weight loss, swollen lymph nodes, body aches, joint swelling, muscle aches, chest pain, shortness of breath, mood changes.   +Headache only on day 1   Review of History: Past Medical History: No past medical history on file.  Past Surgical History:   has no past surgical history on file. Family History: family history is not on file. no family history of autoimmune Social History:  reports that he has been smoking cigarettes. He has been smoking about 0.50 packs per day. He has never used smokeless tobacco. He reports previous alcohol use. He reports that he does not use drugs. Current Medications: has a current medication list which includes the following prescription(s): acetaminophen, benzonatate, guaifenesin-codeine, ibuprofen, oxycodone-acetaminophen, and promethazine. Allergies: has No Known Allergies.   Total time with patient. 21 minutes  Objective:   Unable to see patient on virtual platform today.  Discussed over the phone.

## 2019-12-02 NOTE — Progress Notes (Deleted)
Subjective:   @VITALSMCOMMENTS @  Chief Complaint: Logan Lane, DOB: 04-17-1986, is a 34 y.o. male who presents for No chief complaint on file.   Injury date : *** Visit #: ***  Previous imagine.   History of Present Illness:    Concussion Self-Reported Symptom Score Symptoms rated on a scale 1-6, in last 24 hours  Headache: ***    Nausea: ***  Vomiting: ***  Balance Difficulty: ***   Dizziness: ***  Fatigue: ***  Trouble Falling Asleep: ***   Sleep More Than Usual: ***  Sleep Less Than Usual: ***  Daytime Drowsiness: ***  Photophobia: ***  Phonophobia: ***  Feeling anxious: ***  Confused: ***  Irritability: ***  Sadness: ***  Nervousness: ***  Feeling More Emotional: ***  Numbness or Tingling: ***  Feeling Slowed Down: ***  Feeling Mentally Foggy: ***  Difficulty Concentrating: ***  Difficulty Remembering: ***  Visual Problems: ***  Neck Pain: ***  Tinnitus: ***   Total Symptom Score: *** Previous Symptom Score: ***  Review of Systems:  No , visual changes, nausea, vomiting, diarrhea, constipation, dizziness, abdominal pain, skin rash, fevers, chills, night sweats, weight loss, swollen lymph nodes, body aches, joint swelling, muscle aches, chest pain, shortness of breath, mood changes.   +Headache   Review of History: Past Medical History: @PMHP @  Past Surgical History:  has no past surgical history on file. Family History: family history is not on file. no family history of autoimmune Social History:  reports that he has been smoking cigarettes. He has been smoking about 0.50 packs per day. He has never used smokeless tobacco. He reports previous alcohol use. He reports that he does not use drugs. Current Medications: has a current medication list which includes the following prescription(s): acetaminophen, benzonatate, guaifenesin-codeine, ibuprofen, oxycodone-acetaminophen, and promethazine. Allergies: has No Known Allergies.  Objective:     Physical Examination There were no vitals filed for this visit. General: No apparent distress alert and oriented x3 mood and affect normal, dressed appropriately.  HEENT: Pupils equal, extraocular movements intact  Respiratory: Patient's speak in full sentences and does not appear short of breath  Cardiovascular: No lower extremity edema, non tender, no erythema  Skin: Warm dry intact with no signs of infection or rash on extremities or on axial skeleton.  Abdomen: Soft nontender  Neuro: Cranial nerves II through XII are intact, neurovascularly intact in all extremities with 2+ DTRs and 2+ pulses.  Lymph: No lymphadenopathy of posterior or anterior cervical chain or axillae bilaterally.  Gait normal with good balance and coordination.  MSK:  Non tender with full range of motion and good stability and symmetric strength and tone of shoulders, elbows, wrist,  knee and ankles bilaterally.  Psychiatric: Oriented X3, intact recent and remote memory, judgement and insight, normal mood and affect  Concussion testing performed today:  I spent *** minutes with patient discussing test and results including review of history and patient chart and  integration of patient data, interpretation of standardized test results and clinical data, clinical decision making, treatment planning and report,and interactive feedback to the patient with all of patients questions answered.    Neurocognitive testing (ImPACT):  Baseline:*** Post #1: ***   Verbal Memory Composite *** (***%) *** (***%)   Visual Memory Composite *** (***%) *** (***%)   Visual Motor Speed Composite *** (***%) *** (***%)   Reaction Time Composite *** (***%) *** (***%)   Cognitive Efficiency Index *** ***     Vestibular Screening:   Pre  VOMS  HA Score: *** Pre VOMS  Dizziness Score: ***   Headache  Dizziness  Smooth Pursuits *** ***  H. Saccades *** ***  V. Saccades *** ***  H. VOR *** ***  V. VOR *** ***  Visual Motor  Sensitivity *** ***  Accommodation Right: *** cm Left: *** cm *** ***  Convergence: *** cm Divergence: *** cm *** ***   Balance Screen: ***  Additional testing performed today: { :28529}   Assessment:    No diagnosis found.  Logan Lane presents with the following concussion subtypes. [] Cognitive [] Cervical [] Vestibular [] Ocular [] Migraine [] Anxiety/Mood   Plan:   Action/Discussion: Reviewed diagnosis, management options, expected outcomes, and the reasons for scheduled and emergent follow-up. Questions were adequately answered. Patient expressed verbal understanding and agreement with the following plan.      Participation in school/work: Patient is cleared to return to work/school and activities of daily living without restrictions.  Patient is not cleared to return to work/school until further notice.  Patient may return to work/school on ***, with the following restrictions/supports:  Extra Time:  Take mental rest breaks during the day as needed. Check for return of symptoms when participating in any activities that require a significant amount of attention or concentration.  Allow extra time to complete tasks.  Please allow *** weeks to make up missed assignments, test, quizzes.  Visual/Vestibular Accommodations in School:  Allow patient to eat lunch in quiet environment with 1-2 classmates.  Allow patient to leave class 5 minutes before end of period to avoid busy/noisy hallway.  Please provide any supplemental learning materials (power points, lecture notes, handouts, etc) in minimum size 18 font and allow/provide any auditory supplements to learning when possible (books on tape, audio tape lectures, etc) to limit visual stress in the classroom.  Patient is cleared for auditory participation only. Patient is not cleared for homework, quizzes, or tests at this time.   Testing:  May begin taking tests/quizzes on *** with no more than one test/quiz per  day.   No significant classroom or standardized testing until ***.  Home/Extracurricular:  Lessen work/homework load to allow adequate cognitive rest. Work *** minutes with intervals of *** minute breaks (total *** hours).  Limit visual stimulants including: driving, watching television/movies, reading, using cell phone, etc. - to ensure relative visual cognitive rest. NOT cleared for video or phone games. May participate *** minutes with intervals of *** minute breaks (total *** hours).    Active Treatment Strategies:  Fueling your brain is important for recovery. It is essential to stay well hydrated, aiming for half of your body weight in fluid ounces per day (100 lbs = 50 oz). We also recommend eating breakfast to start your day and focus on a well-balanced diet containing lean protein, 'good' fats, and complex carbohydrates. See your nutrition / hydration handout for more details.   Quality sleep is vital in your concussion recovery. We encourage lots of sleep for the first 24-72 hours after injury but following this period it is important to regulate your sleep cycle. We encourage 8 hours of quality sleep per night. See your sleep handout for more details and strategies to quality sleep.  I  Treating your vestibular and visual dysfunction will decrease your recovery time and improve your symptoms. Begin your home vestibular exercise program as directed on your AVS.    Begin taking DHA supplement as directed.  .   Follow-up information:  Follow up appointment at [facility] .  Patient needs to arrive 30 minutes prior to appointment to complete the following tests: { :28378}.    Patient Education:  Reviewed with patient the risks (i.e, a repeat concussion, post-concussion syndrome, second-impact syndrome) of returning to play prior to complete resolution, and thoroughly reviewed the signs and symptoms of concussion.Reviewed need for complete resolution of all symptoms,  with rest AND exertion, prior to return to play.  Reviewed red flags for urgent medical evaluation: worsening symptoms, nausea/vomiting, intractable headache, musculoskeletal changes, focal neurological deficits.  [facility] Con[facility]ussion Care Plan, which clearly outlines the plans stated above, was given to patient.   The above was documentation by clinical scribe has been reviewed and is accurate and complete   In addition to the time spent performing tests, I spent *** min face to face w/ pt with greater than 50% of that time in counseling on:   Reviewed with patient the risks (i.e, a repeat concussion, post-concussion syndrome, second-impact syndrome) of returning to play prior to complete resolution, and thoroughly reviewed the signs and symptoms of      concussion. Reviewedf need for complete resolution of all symptoms, with rest AND exertion, prior to return to play.  Reviewed red flags for urgent medical evaluation: worsening symptoms, nausea/vomiting, intractable headache, musculoskeletal changes, focal neurological deficits.  [facility] Concussion Care Plan, which clearly outlines the plans stated above, was given to patient   After Visit Summary printed out and provided to patient as appropriate.

## 2019-12-02 NOTE — Assessment & Plan Note (Signed)
Virtual platform did not seem to work.  Discussed with patient in great length.  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  Patient understands this and we will see him Monday.  Already has a note out of work until then secondary to the head injury.  At that time if patient passes all test we will release patient back to full duty.

## 2019-12-07 ENCOUNTER — Other Ambulatory Visit: Payer: Self-pay

## 2019-12-07 ENCOUNTER — Encounter: Payer: Self-pay | Admitting: Family Medicine

## 2019-12-07 ENCOUNTER — Ambulatory Visit (INDEPENDENT_AMBULATORY_CARE_PROVIDER_SITE_OTHER): Payer: PRIVATE HEALTH INSURANCE | Admitting: Family Medicine

## 2019-12-07 DIAGNOSIS — S0990XD Unspecified injury of head, subsequent encounter: Secondary | ICD-10-CM | POA: Diagnosis not present

## 2019-12-07 NOTE — Progress Notes (Signed)
[facility] [address] [place] [address] Phone: [phone] Subjective:   I Ronelle Nigh am serving as a Neurosurgeon for Dr. Antoine Primas.  This visit occurred during the SARS-CoV-2 public health emergency.  Safety protocols were in place, including screening questions prior to the visit, additional usage of staff PPE, and extensive cleaning of exam room while observing appropriate contact time as indicated for disinfecting solutions.   CC: Head injury follow-up  KPT:[id number]   12/02/2019 Virtual platform did not seem to work.  Discussed with patient in great length.  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  Patient understands this and we will see him Monday.  Already has a note out of work until then secondary to the head injury.  At that time if patient passes all test we will release patient back to full duty.Virtual platform did not seem to work.  Discussed with patient in great length.  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  Patient understands this and we will see him Monday.  Already has a note out of work until then secondary to the head injury.  At that time if patient passes all test we will release patient back to full duty.  12/07/2019 Logan Lane is a 34 y.o. male coming in with complaint of concussion. Patient states he is doing well. Has been doing yard work. States he has no symptoms.  Patient states that he feels like himself.  Patient would like to get back to work.  Only mild itching around the patient has some stitches but states no headaches, dizziness, any loss of vision.  Feels like himself.     No past medical history on file. No past surgical history on file. Social  History   Socioeconomic History  . Marital status: Married    Spouse name: Not on file  . Number of children: Not on file  . Years of education: Not on file  . Highest education level: Not on file  Occupational History  . Not on file  Tobacco Use  . Smoking status: Current Every Day Smoker    Packs/day: 0.50    Types: Cigarettes  . Smokeless tobacco: Never Used  Substance and Sexual Activity  . Alcohol use: Not Currently  . Drug use: No  . Sexual activity: Not on file  Other Topics Concern  . Not on file  Social History Narrative  . Not on file   Social Determinants of Health   Financial Resource Strain:   . Difficulty of Paying Living Expenses:   Food Insecurity:   . Worried About Programme researcher, broadcasting/film/video in the Last Year:   . Barista in the Last Year:   Transportation Needs:   . Freight forwarder (Medical):   Marland Kitchen Lack of Transportation (Non-Medical):   Physical Activity:   . Days of Exercise per Week:   . Minutes of Exercise per Session:   Stress:   . Feeling of Stress :   Social Connections:   . Frequency of Communication with Friends and Family:   . Frequency of Social Gatherings with Friends and Family:   . Attends Religious Services:   . Active Member of Clubs or  Organizations:   . Attends Archivist Meetings:   Marland Kitchen Marital Status:    No Known Allergies No family history on file.    Current Outpatient Medications (Respiratory):  .  benzonatate (TESSALON) 200 MG capsule, Take 1 capsule (200 mg total) by mouth 3 (three) times daily as needed for cough. Marland Kitchen  guaiFENesin-codeine (ROBITUSSIN AC) 100-10 MG/5ML syrup, Take 5 mLs by mouth 3 (three) times daily as needed for cough. .  promethazine (PHENERGAN) 25 MG tablet, Take 1 tablet (25 mg total) by mouth every 6 (six) hours as needed (headache).  Current Outpatient Medications (Analgesics):  .  acetaminophen (TYLENOL) 500 MG tablet, Take 1,000 mg by mouth every 6 (six) hours as needed for mild  pain or moderate pain. Marland Kitchen  ibuprofen (ADVIL,MOTRIN) 200 MG tablet, Take 400 mg by mouth every 6 (six) hours as needed for mild pain or moderate pain. Marland Kitchen  oxyCODONE-acetaminophen (PERCOCET/ROXICET) 5-325 MG per tablet, Take 1 tablet by mouth every 4 (four) hours as needed for severe pain.     Reviewed prior external information including notes and imaging from  primary care provider As well as notes that were available from care everywhere and other healthcare systems.  Past medical history, social, surgical and family history all reviewed in electronic medical record.  No pertanent information unless stated regarding to the chief complaint.   Review of Systems:  No headache, visual changes, nausea, vomiting, diarrhea, constipation, dizziness, abdominal pain, skin rash, fevers, chills, night sweats, weight loss, swollen lymph nodes, body aches, joint swelling, chest pain, shortness of breath, mood changes. POSITIVE muscle aches  Objective  Blood pressure 100/62, pulse 71, height 6' (1.829 m), weight 193 lb (87.5 kg), SpO2 98 %.   General: No apparent distress alert and oriented x3 mood and affect normal, dressed appropriately.  4 staples are in patient's underlying wound today with patient's initial injury seeming to be well-healed. HEENT: Pupils equal, extraocular movements intact  Respiratory: Patient's speak in full sentences and does not appear short of breath  Cardiovascular: No lower extremity edema, non tender, no erythema  Neuro: Cranial nerves II through XII are intact, neurovascularly intact in all extremities with 2+ DTRs and 2+ pulses.  Gait normal with good balance and coordination.  MSK:  Non tender with full range of motion and good stability and symmetric strength and tone of shoulders, elbows, wrist, hip, knee and ankles bilaterally.  Patient did very well with serial sevens, patient did very well with no nystagmus on any other testing.  Good balance noted.  30 out of 30.     Impression and Recommendations:      The above documentation has been reviewed and is accurate and complete Lyndal Pulley, DO       Note: This dictation was prepared with Dragon dictation along with smaller phrase technology. Any transcriptional errors that result from this process are unintentional.

## 2019-12-07 NOTE — Assessment & Plan Note (Signed)
Patient looks back to his baseline and is fully released to resume workStarting tomorrow.

## 2020-12-10 ENCOUNTER — Emergency Department (HOSPITAL_COMMUNITY)
Admission: EM | Admit: 2020-12-10 | Discharge: 2020-12-10 | Disposition: A | Discharge status: Home / Self Care | Payer: Self-pay | Attending: Emergency Medicine | Admitting: Emergency Medicine

## 2020-12-10 ENCOUNTER — Other Ambulatory Visit: Payer: Self-pay

## 2020-12-10 ENCOUNTER — Encounter (HOSPITAL_COMMUNITY): Payer: Self-pay | Admitting: Emergency Medicine

## 2020-12-10 DIAGNOSIS — Z28310 Unvaccinated for covid-19: Secondary | ICD-10-CM | POA: Insufficient documentation

## 2020-12-10 DIAGNOSIS — R531 Weakness: Secondary | ICD-10-CM | POA: Insufficient documentation

## 2020-12-10 DIAGNOSIS — R519 Headache, unspecified: Secondary | ICD-10-CM | POA: Insufficient documentation

## 2020-12-10 DIAGNOSIS — R1010 Upper abdominal pain, unspecified: Secondary | ICD-10-CM | POA: Insufficient documentation

## 2020-12-10 DIAGNOSIS — F17210 Nicotine dependence, cigarettes, uncomplicated: Secondary | ICD-10-CM | POA: Insufficient documentation

## 2020-12-10 DIAGNOSIS — R197 Diarrhea, unspecified: Secondary | ICD-10-CM | POA: Insufficient documentation

## 2020-12-10 DIAGNOSIS — R112 Nausea with vomiting, unspecified: Secondary | ICD-10-CM | POA: Insufficient documentation

## 2020-12-10 DIAGNOSIS — Z20822 Contact with and (suspected) exposure to covid-19: Secondary | ICD-10-CM | POA: Insufficient documentation

## 2020-12-10 LAB — COMPREHENSIVE METABOLIC PANEL
ALT: 63 U/L — ABNORMAL HIGH (ref 0–44)
AST: 42 U/L — ABNORMAL HIGH (ref 15–41)
Albumin: 4 g/dL (ref 3.5–5.0)
Alkaline Phosphatase: 61 U/L (ref 38–126)
Anion gap: 9 (ref 5–15)
BUN: 17 mg/dL (ref 6–20)
CO2: 27 mmol/L (ref 22–32)
Calcium: 8.8 mg/dL — ABNORMAL LOW (ref 8.9–10.3)
Chloride: 101 mmol/L (ref 98–111)
Creatinine, Ser: 0.99 mg/dL (ref 0.61–1.24)
GFR, Estimated: >60 mL/min (ref >60)
Glucose, Bld: 98 mg/dL (ref 70–99)
Potassium: 3.6 mmol/L (ref 3.5–5.1)
Sodium: 137 mmol/L (ref 135–145)
Total Bilirubin: 0.5 mg/dL (ref 0.3–1.2)
Total Protein: 6.5 g/dL (ref 6.5–8.1)

## 2020-12-10 LAB — CBC WITH DIFFERENTIAL/PLATELET
Abs Immature Granulocytes: 0.02 10*3/uL (ref 0.00–0.07)
Basophils Absolute: 0 10*3/uL (ref 0.0–0.1)
Basophils Relative: 1 %
Eosinophils Absolute: 0 10*3/uL (ref 0.0–0.5)
Eosinophils Relative: 1 %
HCT: 45.8 % (ref 39.0–52.0)
Hemoglobin: 15.5 g/dL (ref 13.0–17.0)
Immature Granulocytes: 1 %
Lymphocytes Relative: 24 %
Lymphs Abs: 1 10*3/uL (ref 0.7–4.0)
MCH: 29.1 pg (ref 26.0–34.0)
MCHC: 33.8 g/dL (ref 30.0–36.0)
MCV: 85.9 fL (ref 80.0–100.0)
Monocytes Absolute: 0.5 10*3/uL (ref 0.1–1.0)
Monocytes Relative: 13 %
Neutro Abs: 2.5 10*3/uL (ref 1.7–7.7)
Neutrophils Relative %: 60 %
Platelets: 192 10*3/uL (ref 150–400)
RBC: 5.33 MIL/uL (ref 4.22–5.81)
RDW: 12.8 % (ref 11.5–15.5)
WBC: 4 10*3/uL (ref 4.0–10.5)
nRBC: 0 % (ref 0.0–0.2)

## 2020-12-10 LAB — RESP PANEL BY RT-PCR (FLU A&B, COVID) ARPGX2
Influenza A by PCR: NEGATIVE
Influenza B by PCR: NEGATIVE
SARS Coronavirus 2 by RT PCR: NEGATIVE

## 2020-12-10 MED ORDER — SODIUM CHLORIDE 0.9 % IV BOLUS
1000.0000 mL | Freq: Once | INTRAVENOUS | Status: AC
  Administered 2020-12-10: 1000 mL via INTRAVENOUS

## 2020-12-10 MED ORDER — ONDANSETRON HCL 4 MG/2ML IJ SOLN
4.0000 mg | Freq: Once | INTRAMUSCULAR | Status: AC
  Administered 2020-12-10: 4 mg via INTRAVENOUS
  Filled 2020-12-10: qty 2

## 2020-12-10 NOTE — Discharge Instructions (Signed)
COVID test this evening was negative.  You likely have a stomach virus.  I recommend that you take over-the-counter Imodium as directed if needed for diarrhea.  Tylenol and/or ibuprofen if needed for fever.  Follow-up with your primary care provider for recheck or return to the emergency department for any new or worsening symptoms.

## 2020-12-10 NOTE — ED Notes (Signed)
Pt VS updated, pt denies nausea, no emesis noted. Pt tolerating PO intake of ginger ale without complication, will continue to monitor.

## 2020-12-10 NOTE — ED Provider Notes (Signed)
[facility] EMERGENCY DEPARTMENT Provider Note   CSN: [id number] Arrival date & time: 12/10/20  1809     History Chief Complaint  Patient presents with  . Emesis  . Weakness  . Diarrhea    Logan Lane is a 35 y.o. male.  HPI     Logan Lane is a 35 y.o. male who presents to the Emergency Department complaining of generalized headache, nausea, vomiting, diarrhea, symptoms began 4 days ago.  Associated with fever.  Reports max fever at home of 101 at onset.  He also complains of mid to upper abdominal pain that he describes as cramping and worsens just before diarrhea.  He endorses multiple watery brown stools.  He has tried Pepto-Bismol and Tums without improvement.  Denies any bloody or black stools.  Patient is also here with his 33-year-old son who has similar symptoms.  He denies cough, nasal congestion, sore throat, chest pain, shortness of breath and dysuria.  No known COVID exposures and states that he had COVID last year.  He is not vaccinated against COVID virus.    History reviewed. No pertinent past medical history.  Patient Active Problem List   Diagnosis Date Noted  . Head injury 12/02/2019    History reviewed. No pertinent surgical history.     History reviewed. No pertinent family history.  Social History   Tobacco Use  . Smoking status: Current Every Day Smoker    Packs/day: 0.50    Types: Cigarettes  . Smokeless tobacco: Never Used  Vaping Use  . Vaping Use: Never used  Substance Use Topics  . Alcohol use: Not Currently  . Drug use: No    Home Medications Prior to Admission medications   Medication Sig Start Date End Date Taking? Authorizing Provider  acetaminophen (TYLENOL) 500 MG tablet Take 1,000 mg by mouth every 6 (six) hours as needed for mild pain or moderate pain.    [provider]  benzonatate (TESSALON) 200 MG capsule Take 1 capsule (200 mg total) by mouth 3 (three) times daily as needed for cough. 12/30/14    Janne Napoleon, NP  guaiFENesin-codeine [facility]) 100-10 MG/5ML syrup Take 5 mLs by mouth 3 (three) times daily as needed for cough. 12/30/14   Janne Napoleon, NP  ibuprofen (ADVIL,MOTRIN) 200 MG tablet Take 400 mg by mouth every 6 (six) hours as needed for mild pain or moderate pain.    [provider]  oxyCODONE-acetaminophen (PERCOCET/ROXICET) 5-325 MG per tablet Take 1 tablet by mouth every 4 (four) hours as needed for severe pain. 07/21/13   Dione Booze, MD  promethazine (PHENERGAN) 25 MG tablet Take 1 tablet (25 mg total) by mouth every 6 (six) hours as needed (headache). 06/26/16   Gilda Crease, MD    Allergies    Patient has no known allergies.  Review of Systems   Review of Systems  Constitutional: Negative for appetite change, chills, fatigue and fever.  HENT: Negative for sore throat and trouble swallowing.   Respiratory: Negative for cough and shortness of breath.   Cardiovascular: Negative for chest pain.  Gastrointestinal: Positive for diarrhea, nausea and vomiting. Negative for abdominal pain.  Genitourinary: Negative for difficulty urinating, dysuria and flank pain.  Musculoskeletal: Negative for myalgias, neck pain and neck stiffness.  Skin: Negative for rash.  Neurological: Negative for dizziness, weakness, numbness and headaches.  Hematological: Does not bruise/bleed easily.    Physical Exam Updated Vital Signs BP 109/74 (BP Location: Left Arm)  Pulse (!) 105   Temp 98 F (36.7 C) (Oral)   Resp 18   Ht 6' (1.829 m)   Wt 87.5 kg   SpO2 97%   BMI 26.18 kg/m   Physical Exam Constitutional:      Appearance: Normal appearance.  HENT:     Head: Normocephalic.  Eyes:     Pupils: Pupils are equal, round, and reactive to light.  Neck:     Thyroid: No thyromegaly.     Meningeal: Kernig's sign absent.  Cardiovascular:     Rate and Rhythm: Normal rate and regular rhythm.     Heart sounds: Normal heart sounds.  Pulmonary:     Effort:  Pulmonary effort is normal.     Breath sounds: Normal breath sounds. No wheezing.  Abdominal:     Palpations: Abdomen is soft.     Tenderness: There is no abdominal tenderness. There is no guarding or rebound.  Musculoskeletal:        General: Normal range of motion.     Cervical back: Normal range of motion and neck supple.  Skin:    General: Skin is warm and dry.     Findings: No rash.  Neurological:     Mental Status: He is alert and oriented to person, place, and time.     ED Results / Procedures / Treatments   Labs (all labs ordered are listed, but only abnormal results are displayed) Labs Reviewed  COMPREHENSIVE METABOLIC PANEL - Abnormal; Notable for the following components:      Result Value   Calcium 8.8 (*)    AST 42 (*)    ALT 63 (*)    All other components within normal limits  RESP PANEL BY RT-PCR (FLU A&B, COVID) ARPGX2  CBC WITH DIFFERENTIAL/PLATELET    EKG None  Radiology No results found.  Procedures Procedures   Medications Ordered in ED Medications  sodium chloride 0.9 % bolus 1,000 mL (has no administration in time range)  ondansetron (ZOFRAN) injection 4 mg (has no administration in time range)    ED Course  I have reviewed the triage vital signs and the nursing notes.  Pertinent labs & imaging results that were available during my care of the patient were reviewed by me and considered in my medical decision making (see chart for details).    MDM Rules/Calculators/A&P                          Patient here with his son both with GI symptoms.  Patient having nausea vomiting and diarrhea.  Diarrhea described as watery without recent antibiotics, no bloody or black stools.  COVID-positive last year.  Patient is not vaccinated. On exam, abdomen is soft and nontender without peritoneal signs.  Nontoxic-appearing.  Symptoms likely viral.  Doubt acute abdominal process or C. difficile.  Check labs and administer IV fluids.  On recheck, patient  reports feeling better after receiving fluids, he has tolerated oral fluid challenge without further vomiting or diarrhea.  COVID test negative.  Labs unremarkable.  Likely GI virus.  Patient appears appropriate for discharge home, agrees to symptomatic treatment and have recommended over-the-counter Imodium for his diarrhea.  Strict return precautions discussed.   The patient appears reasonably screened and/or stabilized for discharge and I doubt any other medical condition or other [facility] requiring further screening, evaluation, or treatment in the ED at this time prior to discharge.   Final Clinical Impression(s) / ED Diagnoses Final  diagnoses:  Nausea vomiting and diarrhea    Rx / DC Orders ED Discharge Orders    None       Pauline Aus, PA-C 12/10/20 2240    Jacalyn Lefevre, MD 12/11/20 1553

## 2020-12-10 NOTE — ED Triage Notes (Signed)
Pt has been having emesis, loose stools, weakness, since Tuesday.

## 2021-08-10 IMAGING — CT CT HEAD W/O CM
3 series · 15 of 47 positions shown, 18 images · non-contrast
Comparison: 06/26/2016

CLINICAL DATA: Headache, head laceration

EXAM:
CT HEAD WITHOUT CONTRAST
TECHNIQUE: Contiguous axial images were obtained from the base of the skull
through the vertex without intravenous contrast.

[Series 2: head w o · axial · 0.48mm/px · z∈[+1292,+1427]mm · 9 of 33 slices shown, 12 images]
[im 3/33  brain]
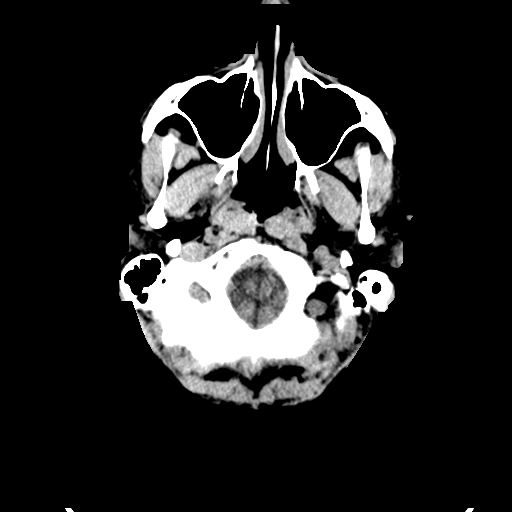
[im 3/33  bone]
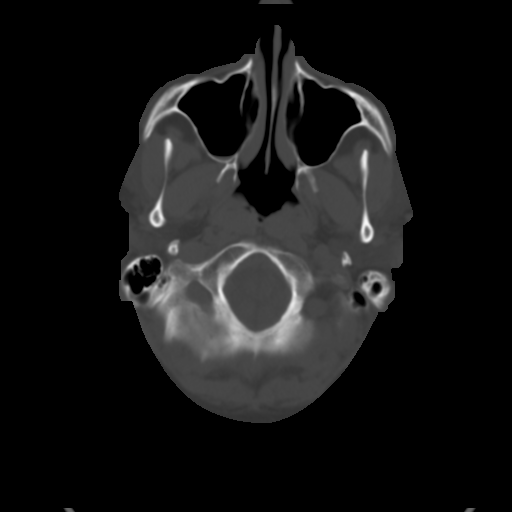
[im 6/33  brain]
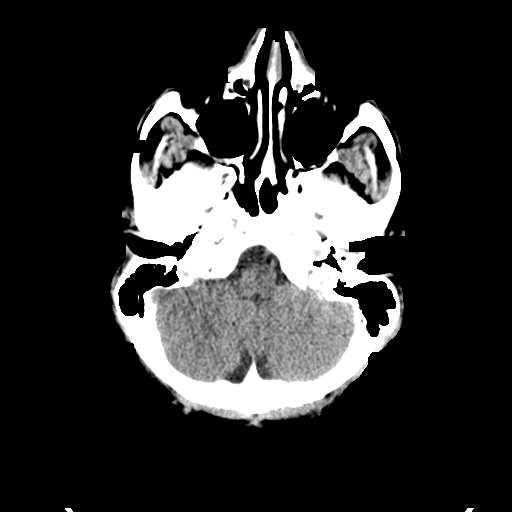
[im 9/33  brain]
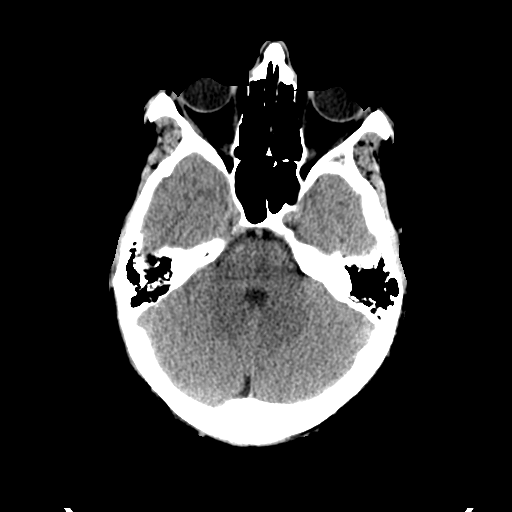
[im 13/33  brain]
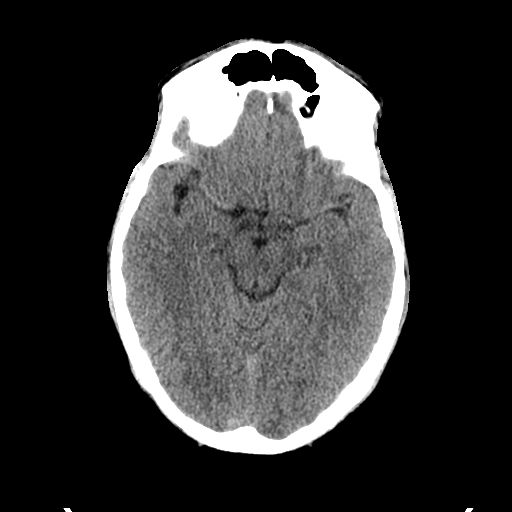
[im 17/33  brain]
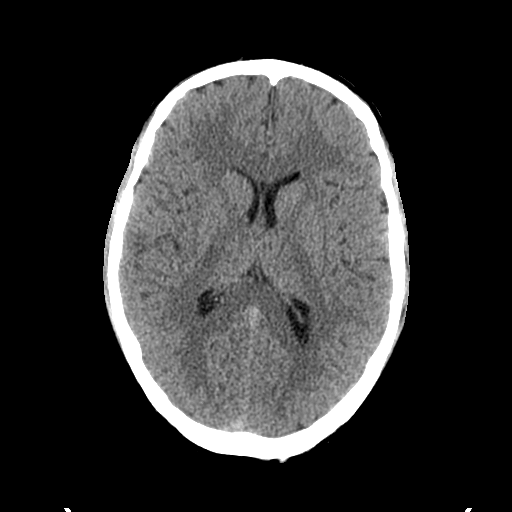
[im 17/33  bone]
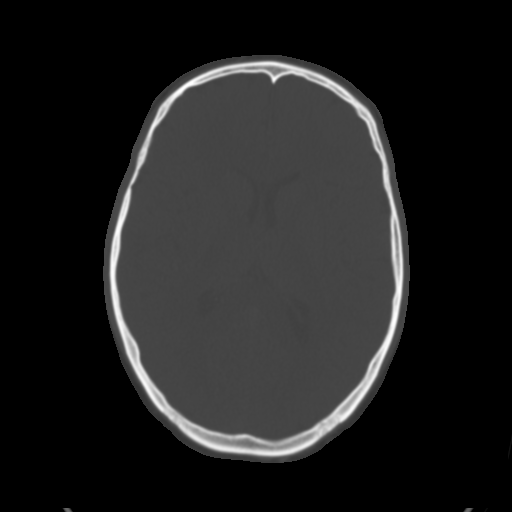
[im 20/33  brain]
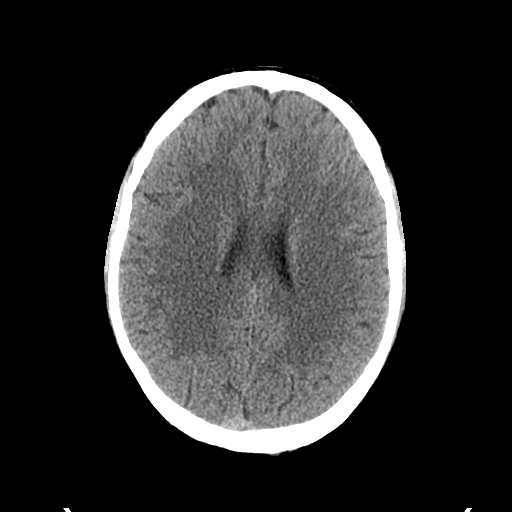
[im 24/33  brain]
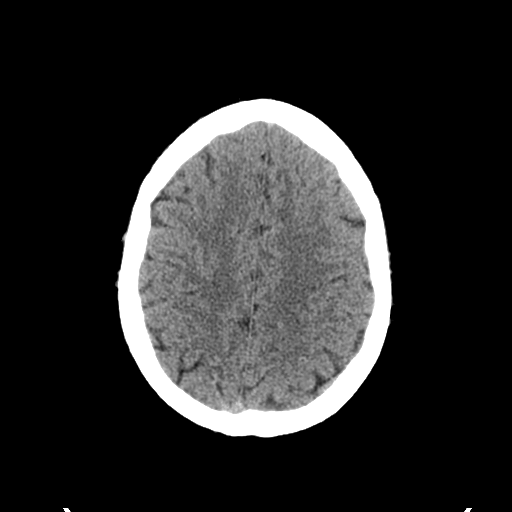
[im 27/33  brain]
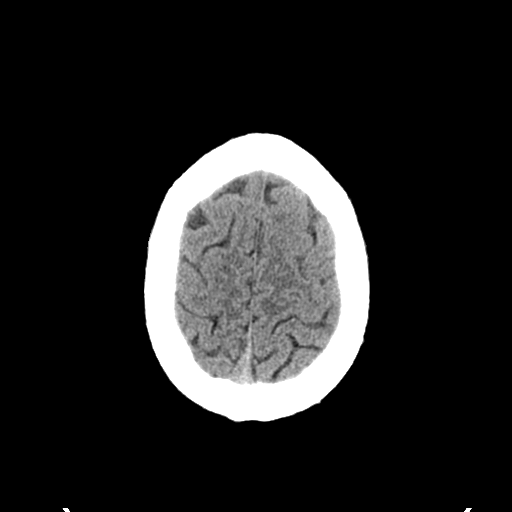
[im 30/33  brain]
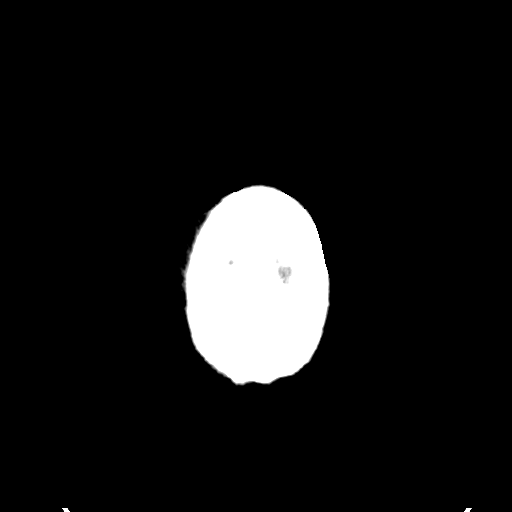
[im 30/33  bone]
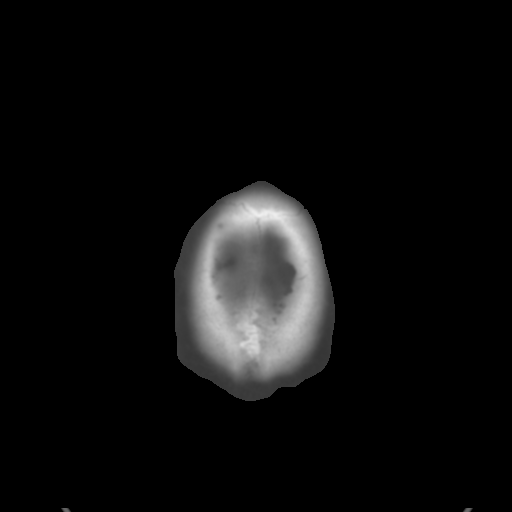

[Series 4: coronal soft · coronal · 0.34mm/px · 3 of 77 slices shown]
[im 26/77  brain]
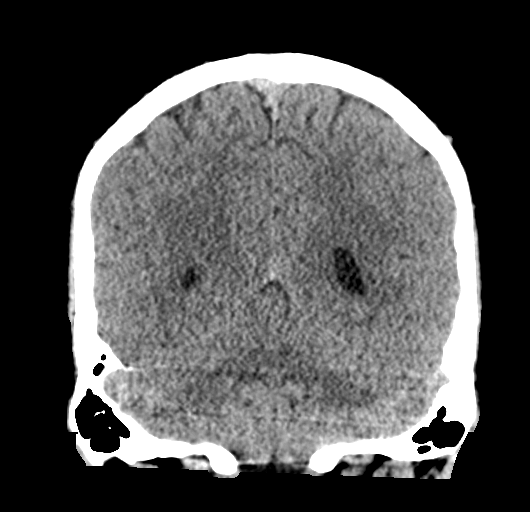
[im 34/77  brain]
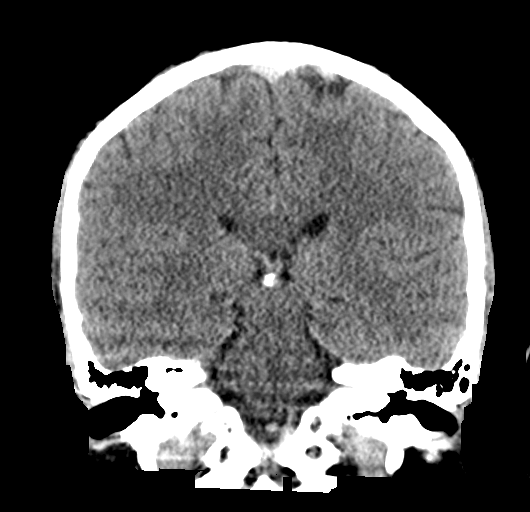
[im 43/77  brain]
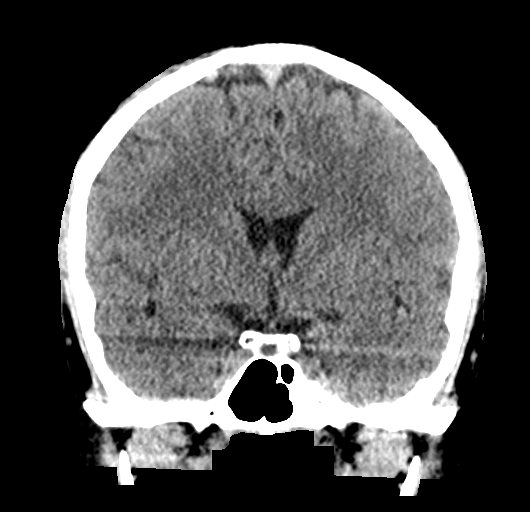

[Series 5: sagittal soft · sagittal · 0.34mm/px · 3 of 56 slices shown]
[im 19/56  brain]
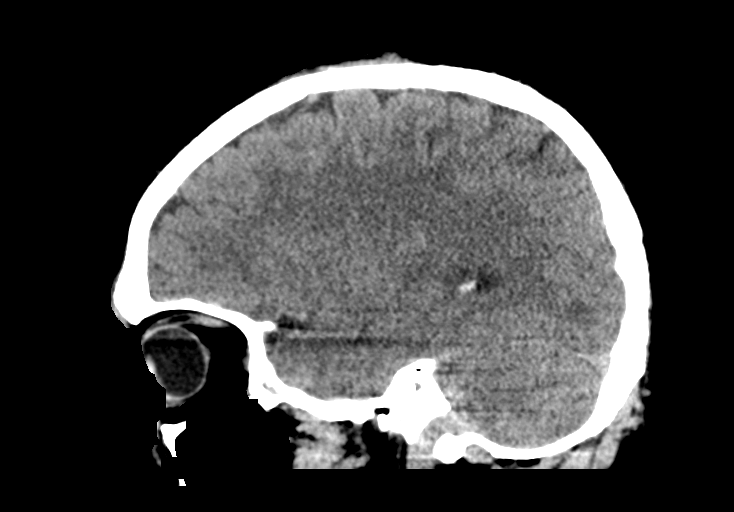
[im 28/56  brain]
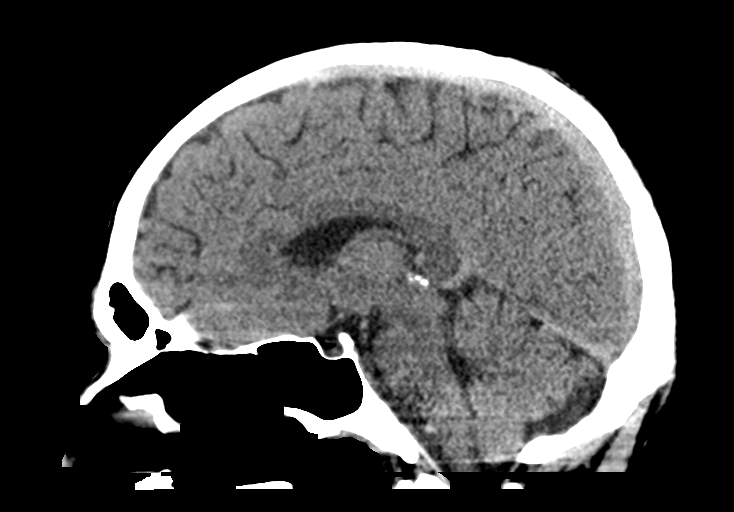
[im 37/56  brain]
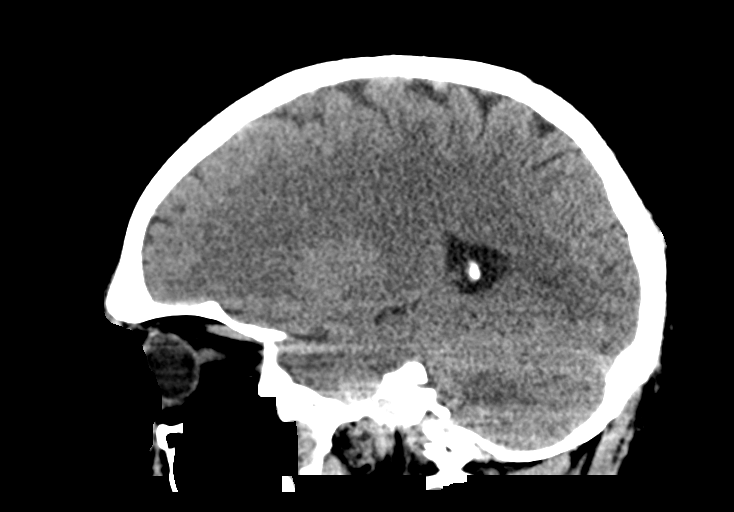

[15 of 47 positions shown; findings below may reference images not displayed]

FINDINGS: Brain: No evidence of acute infarction, hemorrhage, hydrocephalus,
extra-axial collection or mass lesion/mass effect.

Vascular: No hyperdense vessel or unexpected calcification.

Skull: Normal. Negative for fracture or focal lesion.

Sinuses/Orbits: Mild right maxillary sinus disease. Remaining
paranasal sinuses and mastoid air cells are clear. Orbital
structures unremarkable.

Other: None.
IMPRESSION: 1. No acute intracranial findings.
2. Mild right maxillary sinus disease.

## 2021-10-01 ENCOUNTER — Emergency Department (HOSPITAL_COMMUNITY)
Admission: EM | Admit: 2021-10-01 | Discharge: 2021-10-01 | Disposition: A | Discharge status: Home / Self Care | Payer: 59 | Attending: Emergency Medicine | Admitting: Emergency Medicine

## 2021-10-01 ENCOUNTER — Emergency Department (HOSPITAL_COMMUNITY): Payer: 59

## 2021-10-01 ENCOUNTER — Encounter (HOSPITAL_COMMUNITY): Payer: Self-pay | Admitting: Emergency Medicine

## 2021-10-01 DIAGNOSIS — R0789 Other chest pain: Secondary | ICD-10-CM | POA: Insufficient documentation

## 2021-10-01 DIAGNOSIS — R197 Diarrhea, unspecified: Secondary | ICD-10-CM

## 2021-10-01 DIAGNOSIS — R1084 Generalized abdominal pain: Secondary | ICD-10-CM | POA: Insufficient documentation

## 2021-10-01 DIAGNOSIS — Z20822 Contact with and (suspected) exposure to covid-19: Secondary | ICD-10-CM | POA: Diagnosis not present

## 2021-10-01 DIAGNOSIS — R7401 Elevation of levels of liver transaminase levels: Secondary | ICD-10-CM | POA: Insufficient documentation

## 2021-10-01 DIAGNOSIS — M791 Myalgia, unspecified site: Secondary | ICD-10-CM

## 2021-10-01 DIAGNOSIS — B349 Viral infection, unspecified: Secondary | ICD-10-CM | POA: Diagnosis not present

## 2021-10-01 DIAGNOSIS — R112 Nausea with vomiting, unspecified: Secondary | ICD-10-CM

## 2021-10-01 DIAGNOSIS — B9789 Other viral agents as the cause of diseases classified elsewhere: Secondary | ICD-10-CM

## 2021-10-01 LAB — COMPREHENSIVE METABOLIC PANEL
ALT: 49 U/L — ABNORMAL HIGH (ref 0–44)
AST: 31 U/L (ref 15–41)
Albumin: 4.6 g/dL (ref 3.5–5.0)
Alkaline Phosphatase: 75 U/L (ref 38–126)
Anion gap: 12 (ref 5–15)
BUN: 15 mg/dL (ref 6–20)
CO2: 25 mmol/L (ref 22–32)
Calcium: 9.3 mg/dL (ref 8.9–10.3)
Chloride: 102 mmol/L (ref 98–111)
Creatinine, Ser: 1.02 mg/dL (ref 0.61–1.24)
GFR, Estimated: >60 mL/min (ref >60)
Glucose, Bld: 141 mg/dL — ABNORMAL HIGH (ref 70–99)
Potassium: 4 mmol/L (ref 3.5–5.1)
Sodium: 139 mmol/L (ref 135–145)
Total Bilirubin: 0.8 mg/dL (ref 0.3–1.2)
Total Protein: 7.6 g/dL (ref 6.5–8.1)

## 2021-10-01 LAB — CBC WITH DIFFERENTIAL/PLATELET
Abs Immature Granulocytes: 0.03 10*3/uL (ref 0.00–0.07)
Basophils Absolute: 0.1 10*3/uL (ref 0.0–0.1)
Basophils Relative: 0 %
Eosinophils Absolute: 0.1 10*3/uL (ref 0.0–0.5)
Eosinophils Relative: 1 %
HCT: 52.4 % — ABNORMAL HIGH (ref 39.0–52.0)
Hemoglobin: 17.9 g/dL — ABNORMAL HIGH (ref 13.0–17.0)
Immature Granulocytes: 0 %
Lymphocytes Relative: 5 %
Lymphs Abs: 0.6 10*3/uL — ABNORMAL LOW (ref 0.7–4.0)
MCH: 29.3 pg (ref 26.0–34.0)
MCHC: 34.2 g/dL (ref 30.0–36.0)
MCV: 85.9 fL (ref 80.0–100.0)
Monocytes Absolute: 0.8 10*3/uL (ref 0.1–1.0)
Monocytes Relative: 7 %
Neutro Abs: 10.2 10*3/uL — ABNORMAL HIGH (ref 1.7–7.7)
Neutrophils Relative %: 87 %
Platelets: 180 10*3/uL (ref 150–400)
RBC: 6.1 MIL/uL — ABNORMAL HIGH (ref 4.22–5.81)
RDW: 13.2 % (ref 11.5–15.5)
WBC: 11.7 10*3/uL — ABNORMAL HIGH (ref 4.0–10.5)
nRBC: 0 % (ref 0.0–0.2)

## 2021-10-01 LAB — RESP PANEL BY RT-PCR (FLU A&B, COVID) ARPGX2
Influenza A by PCR: NEGATIVE
Influenza B by PCR: NEGATIVE
SARS Coronavirus 2 by RT PCR: NEGATIVE

## 2021-10-01 LAB — LIPASE, BLOOD: Lipase: 24 U/L (ref 11–51)

## 2021-10-01 MED ORDER — ONDANSETRON HCL 4 MG/2ML IJ SOLN
INTRAMUSCULAR | Status: AC
  Filled 2021-10-01: qty 2

## 2021-10-01 MED ORDER — ONDANSETRON HCL 4 MG/2ML IJ SOLN
4.0000 mg | Freq: Once | INTRAMUSCULAR | Status: AC
  Administered 2021-10-01: 4 mg via INTRAVENOUS

## 2021-10-01 MED ORDER — PANTOPRAZOLE SODIUM 40 MG PO TBEC: 40.0000 mg | DELAYED_RELEASE_TABLET | Freq: Every day | ORAL | 0 refills | Status: AC

## 2021-10-01 MED ORDER — PANTOPRAZOLE SODIUM 40 MG IV SOLR
40.0000 mg | Freq: Once | INTRAVENOUS | Status: AC
  Administered 2021-10-01: 40 mg via INTRAVENOUS
  Filled 2021-10-01: qty 10

## 2021-10-01 MED ORDER — ONDANSETRON HCL 4 MG PO TABS: 4.0000 mg | ORAL_TABLET | Freq: Four times a day (QID) | ORAL | 0 refills | Status: AC

## 2021-10-01 MED ORDER — LACTATED RINGERS IV BOLUS
1000.0000 mL | Freq: Once | INTRAVENOUS | Status: AC
  Administered 2021-10-01: 1000 mL via INTRAVENOUS

## 2021-10-01 NOTE — ED Notes (Signed)
Pt had near syncopal episode coming from triage, pt put in wheelchair and taken to room. Alert and oriented at this time

## 2021-10-01 NOTE — ED Provider Notes (Signed)
[facility] EMERGENCY DEPARTMENT Provider Note   CSN: [id number] Arrival date & time: 10/01/21  2040     History  Chief Complaint  Patient presents with   Emesis    Logan Lane is a 36 y.o. male.   Emesis Associated symptoms: abdominal pain, diarrhea, headaches and myalgias       Logan Lane is a 36 y.o. male who presents to the Emergency Department complaining of sudden onset of abdominal pain, chest pain, nausea vomiting and diarrhea.  Symptoms began 3 hours ago.  Had several episodes of vomiting then onset of watery loose stools.  States stools have been nonbloody or black.  Unable to tolerate any liquids since onset.  Patient's spouse states that everyone in the household has had "a stomach bug" recently.  He describes sensation of feeling that his entire stomach is "twisted and not."  He describes squeezing pain of his central chest.  States pain in his abdomen is making him feel short of breath.  He denies fever, chills, cough, sore throat or nasal congestion.  No history of cardiac disease, diabetes or prior abdominal surgeries.    Home Medications Prior to Admission medications   Medication Sig Start Date End Date Taking? Authorizing Provider  acetaminophen (TYLENOL) 500 MG tablet Take 1,000 mg by mouth every 6 (six) hours as needed for mild pain or moderate pain.    [provider]  benzonatate (TESSALON) 200 MG capsule Take 1 capsule (200 mg total) by mouth 3 (three) times daily as needed for cough. 12/30/14   Janne Napoleon, NP  guaiFENesin-codeine [facility]) 100-10 MG/5ML syrup Take 5 mLs by mouth 3 (three) times daily as needed for cough. 12/30/14   Janne Napoleon, NP  ibuprofen (ADVIL,MOTRIN) 200 MG tablet Take 400 mg by mouth every 6 (six) hours as needed for mild pain or moderate pain.    [provider]  oxyCODONE-acetaminophen (PERCOCET/ROXICET) 5-325 MG per tablet Take 1 tablet by mouth every 4 (four) hours as needed for severe pain.  07/21/13   Dione Booze, MD  promethazine (PHENERGAN) 25 MG tablet Take 1 tablet (25 mg total) by mouth every 6 (six) hours as needed (headache). 06/26/16   Gilda Crease, MD      Allergies    Patient has no known allergies.    Review of Systems   Review of Systems  Gastrointestinal:  Positive for abdominal pain, diarrhea, nausea and vomiting.  Musculoskeletal:  Positive for myalgias.  Neurological:  Positive for headaches.  All other systems reviewed and are negative.  Physical Exam Updated Vital Signs BP (!) 141/84 (BP Location: Right Arm)    Pulse (!) 125    Temp 98 F (36.7 C) (Oral)    Resp 18    Ht 6' (1.829 m)    Wt 89.4 kg    SpO2 98%    BMI 26.72 kg/m  Physical Exam Vitals and nursing note reviewed.  Constitutional:      Appearance: Normal appearance. He is not toxic-appearing.  HENT:     Mouth/Throat:     Mouth: Mucous membranes are moist.     Pharynx: Oropharynx is clear.  Eyes:     Conjunctiva/sclera: Conjunctivae normal.  Cardiovascular:     Rate and Rhythm: Normal rate and regular rhythm.     Pulses: Normal pulses.  Pulmonary:     Effort: Pulmonary effort is normal. No respiratory distress.     Comments: Ttp along the right lower rib margin.  No splinting. Chest:     Chest wall: Tenderness present.  Abdominal:     General: There is no distension.     Palpations: Abdomen is soft.     Tenderness: There is abdominal tenderness. There is no right CVA tenderness or left CVA tenderness.     Comments: Diffuse tenderness to palpation of the abdomen.  Abdomen is soft.  Musculoskeletal:        General: Normal range of motion.  Skin:    General: Skin is warm.     Capillary Refill: Capillary refill takes less than 2 seconds.     Findings: No rash.  Neurological:     General: No focal deficit present.     Mental Status: He is alert.     Sensory: No sensory deficit.     Motor: No weakness.    ED Results / Procedures / Treatments   Labs (all labs ordered  are listed, but only abnormal results are displayed) Labs Reviewed  CBC WITH DIFFERENTIAL/PLATELET - Abnormal; Notable for the following components:      Result Value   WBC 11.7 (*)    RBC 6.10 (*)    Hemoglobin 17.9 (*)    HCT 52.4 (*)    Neutro Abs 10.2 (*)    Lymphs Abs 0.6 (*)    All other components within normal limits  COMPREHENSIVE METABOLIC PANEL - Abnormal; Notable for the following components:   Glucose, Bld 141 (*)    ALT 49 (*)    All other components within normal limits  RESP PANEL BY RT-PCR (FLU A&B, COVID) ARPGX2  LIPASE, BLOOD    EKG EKG Interpretation  Date/Time:  Sunday October 01 2021 20:58:46 EST Ventricular Rate:  109 PR Interval:  145 QRS Duration: 95 QT Interval:  315 QTC Calculation: 425 R Axis:   128 Text Interpretation: Sinus tachycardia Right axis deviation Poor R wave progression ECG OTHERWISE WITHIN NORMAL LIMITS Confirmed by Eber Hong ([id number]) on 10/01/2021 10:47:32 PM  Radiology DG Chest Port 1 View  Result Date: 10/01/2021 CLINICAL DATA:  Vomiting and chest pain. EXAM: PORTABLE CHEST 1 VIEW COMPARISON:  PA and lateral 12/30/2014 FINDINGS: The heart size and mediastinal contours are within normal limits. Both lungs are clear. The visualized skeletal structures are unremarkable. IMPRESSION: No active disease.  Stable chest. Electronically Signed   By: Almira Bar M.D.   On: 10/01/2021 22:35    Procedures Procedures    Medications Ordered in ED Medications - No data to display  ED Course/ Medical Decision Making/ A&P                           Medical Decision Making Patient here accompanied by spouse with reported sudden onset of abdominal pain, nausea vomiting diarrhea and right-sided chest pain.  Spouse states that family members have had "a stomach virus" earlier this week although she describes her symptoms as more of nasal and sinus congestion.  Their son had a 1 day history of vomiting. Patient reports vomiting of small  amounts around 6 PM this evening.  He has had several episodes of loose stools as well.  No black or bloody stools reported.  He describes abdominal pain as "knots in my stomach."  Amount and/or Complexity of Data Reviewed Independent Historian: spouse    Details: Spouse also provided most of history. Labs: ordered.    Details: Labs interpreted by me, no significant leukocytosis.  Chemistries show blood sugar of 141.  ALT slightly elevated at 49.  Remaining chemistries are unremarkable.  COVID and influenza testing negative. Radiology: ordered.    Details: Chest x-ray shows no acute cardiopulmonary findings.  I have personally reviewed x-ray imaging and agree with radiology interpretation. ECG/medicine tests:  Decision-making details documented in ED Course.  Risk Prescription drug management.   Patient here with diffuse abdominal pain, right-sided chest wall pain nausea, vomiting, and diarrhea.  Symptoms are all of sudden onset at 6 PM this evening.  Patient's spouse notes illnesses with other family members earlier this week.  On exam, he has some diffuse abdominal tenderness without guarding and abdomen is soft.  I do not suspect acute abdominal process.  Chest x-ray is reassuring and EKG is without acute ischemic change.  I suspect this is viral process.  Will check baseline labs, COVID and flu and reassess.  On recheck, patient's symptoms appears to be improving.  He has received IV fluids Protonix and antiemetic.  No further vomiting.  Patient is tolerated oral fluid challenge without difficulty.  States he is feeling better and ready for discharge home.  I suspect this is related to a viral process.  Will provide short course of PPI and antiemetic.  Return precautions were discussed.        Final Clinical Impression(s) / ED Diagnoses Final diagnoses:  Nausea vomiting and diarrhea  Myalgia due to viral infection    Rx / DC Orders ED Discharge Orders     None          Rosey Bath 10/01/21 2341    Eber Hong, MD 10/08/21 1606

## 2021-10-01 NOTE — Discharge Instructions (Signed)
It is likely that his symptoms are related to a virus.  I recommend frequent small sips of clear fluids tonight, bland diet as tolerated starting tomorrow.  He may take Tylenol every 4 hours as needed for body aches.  Follow-up with his primary care provider for recheck.  Return emergency department for any new or worsening symptoms.

## 2021-10-01 NOTE — ED Provider Notes (Signed)
Medical screening examination/treatment/procedure(s) were conducted as a shared visit with non-physician practitioner(s) and myself.  I personally evaluated the patient during the encounter.  Clinical Impression:   Final diagnoses:  None   Patient is a 35 year old male, he presents to the hospital with several hours of nausea vomiting and feeling poorly, he has been having some aches and pains going from his abdomen up into his chest.  On exam the patient is appearing generally weak but is able to follow commands answer questions, he has diffuse mild abdominal pain as well as diffuse chest pain to palpation as well.  There is no changes in the skin, no signs of zoster, no subcutaneous emphysema.  His oropharynx is clear, mucous membranes are normal, lower extremities without edema.  EKG is also unremarkable showing no signs of ischemia.  Will obtain chest x-ray to make sure there is no signs of Boerhaave's, patient given IV fluids and nausea medicines, there have been 2 other family members at home that are both been sick this week 1 with an upper respiratory viral type infection and the child that has been having some persistent vomiting as well.  This is all suggestive that this is some type of viral contagious process.      Logan Hong, MD 10/08/21 1606

## 2021-10-01 NOTE — ED Triage Notes (Signed)
Pt c/o N/V/D, body aches, and headache that's started today. Pts family has had stomach bug recently.

## 2022-06-30 ENCOUNTER — Ambulatory Visit
Admission: EM | Admit: 2022-06-30 | Discharge: 2022-06-30 | Disposition: A | Discharge status: Home / Self Care | Payer: Managed Care, Other (non HMO) | Attending: Nurse Practitioner | Admitting: Nurse Practitioner

## 2022-06-30 DIAGNOSIS — J029 Acute pharyngitis, unspecified: Secondary | ICD-10-CM | POA: Diagnosis not present

## 2022-06-30 DIAGNOSIS — J069 Acute upper respiratory infection, unspecified: Secondary | ICD-10-CM | POA: Diagnosis not present

## 2022-06-30 DIAGNOSIS — U071 COVID-19: Secondary | ICD-10-CM | POA: Diagnosis present

## 2022-06-30 LAB — POCT RAPID STREP A (OFFICE): Rapid Strep A Screen: NEGATIVE

## 2022-06-30 LAB — RESP PANEL BY RT-PCR (FLU A&B, COVID) ARPGX2
Influenza A by PCR: NEGATIVE
Influenza B by PCR: NEGATIVE
SARS Coronavirus 2 by RT PCR: POSITIVE — AB

## 2022-06-30 MED ORDER — PROMETHAZINE-DM 6.25-15 MG/5ML PO SYRP: 5.0000 mL | ORAL_SOLUTION | Freq: Four times a day (QID) | ORAL | 0 refills | Status: AC | PRN

## 2022-06-30 MED ORDER — FLUTICASONE PROPIONATE 50 MCG/ACT NA SUSP: 2.0000 | Freq: Every day | NASAL | 0 refills | Status: AC

## 2022-06-30 NOTE — Discharge Instructions (Addendum)
The rapid strep test was negative.  A throat culture and COVID/flu test are pending.  If the results of the pending test are positive, you will be contacted.  If you choose, you are a candidate to receive Paxlovid as an antiviral if the COVID test is positive. Take medication as prescribed. Increase fluids and allow for plenty of rest. Recommend Tylenol or ibuprofen as needed for pain, fever, or general discomfort. Warm salt water gargles 3-4 times daily to help with throat pain or discomfort. Recommend using a humidifier at bedtime during sleep to help with cough and nasal congestion. Sleep elevated on 2 pillows while cough symptoms persist. Follow-up with your primary care physician if symptoms do not improve over the next 10 to 14 days.

## 2022-06-30 NOTE — ED Triage Notes (Signed)
Pt reports stuffy nose, throat hurts, hurts to talk.Bad cough. Wakes up in the middle of the night coughing so bad feels like he is going to vomit.

## 2022-06-30 NOTE — ED Provider Notes (Signed)
[facility]    CSN: [id number] Arrival date & time: 06/30/22  1123      History   Chief Complaint No chief complaint on file.   HPI Logan Lane is a 36 y.o. male.   The history is provided by the patient.   Patient presents with a less than 24-hour history of upper respiratory symptoms.  Patient complains of cough, nasal congestion, runny nose, and sore throat.  He states that he also had a fever last evening.  Patient denies wheezing, shortness of breath, difficulty breathing, or GI symptoms.  Patient states that he took over-the-counter DayQuil and NyQuil for his symptoms.  Denies any known sick contacts, but son is sick with the same or similar symptoms.  History reviewed. No pertinent past medical history.  Patient Active Problem List   Diagnosis Date Noted   Head injury 12/02/2019    History reviewed. No pertinent surgical history.     Home Medications    Prior to Admission medications   Medication Sig Start Date End Date Taking? Authorizing Provider  fluticasone (FLONASE) 50 MCG/ACT nasal spray Place 2 sprays into both nostrils daily. 06/30/22  Yes Floreine Kingdon-Warren, Sadie Haber, NP  promethazine-dextromethorphan (PROMETHAZINE-DM) 6.25-15 MG/5ML syrup Take 5 mLs by mouth 4 (four) times daily as needed for cough. 06/30/22  Yes Eri Platten-Warren, Sadie Haber, NP  acetaminophen (TYLENOL) 500 MG tablet Take 1,000 mg by mouth every 6 (six) hours as needed for mild pain or moderate pain.    [provider]  benzonatate (TESSALON) 200 MG capsule Take 1 capsule (200 mg total) by mouth 3 (three) times daily as needed for cough. 12/30/14   Janne Napoleon, NP  guaiFENesin-codeine [facility] [address]) 100-10 MG/5ML syrup Take 5 mLs by mouth 3 (three) times daily as needed for cough. 12/30/14   Janne Napoleon, NP  ibuprofen (ADVIL,MOTRIN) 200 MG tablet Take 400 mg by mouth every 6 (six) hours as needed for mild pain or moderate pain.    [provider]  ondansetron  (ZOFRAN) 4 MG tablet Take 1 tablet (4 mg total) by mouth every 6 (six) hours. As needed for nausea vomiting 10/01/21   Triplett, Tammy, PA-C  oxyCODONE-acetaminophen (PERCOCET/ROXICET) 5-325 MG per tablet Take 1 tablet by mouth every 4 (four) hours as needed for severe pain. 07/21/13   Dione Booze, MD  pantoprazole (PROTONIX) 40 MG tablet Take 1 tablet (40 mg total) by mouth daily. 10/01/21   Triplett, Tammy, PA-C  promethazine (PHENERGAN) 25 MG tablet Take 1 tablet (25 mg total) by mouth every 6 (six) hours as needed (headache). 06/26/16   Gilda Crease, MD    Family History History reviewed. No pertinent family history.  Social History Social History   Tobacco Use   Smoking status: Every Day    Packs/day: 0.50    Types: Cigarettes   Smokeless tobacco: Never  Vaping Use   Vaping Use: Never used  Substance Use Topics   Alcohol use: Not Currently   Drug use: No     Allergies   Patient has no known allergies.   Review of Systems Review of Systems Per HPI  Physical Exam Triage Vital Signs ED Triage Vitals  Enc Vitals Group     BP 06/30/22 1201 127/84     Pulse Rate 06/30/22 1201 89     Resp 06/30/22 1201 20     Temp 06/30/22 1201 98.8 F (37.1 C)     Temp Source 06/30/22 1201 Oral  SpO2 06/30/22 1201 98 %     Weight --      Height --      Head Circumference --      Peak Flow --      Pain Score 06/30/22 1202 9     Pain Loc --      Pain Edu? --      Excl. in GC? --    No data found.  Updated Vital Signs BP 127/84 (BP Location: Right Arm)   Pulse 89   Temp 98.8 F (37.1 C) (Oral)   Resp 20   SpO2 98%   Visual Acuity Right Eye Distance:   Left Eye Distance:   Bilateral Distance:    Right Eye Near:   Left Eye Near:    Bilateral Near:     Physical Exam Vitals and nursing note reviewed.  Constitutional:      General: He is not in acute distress.    Appearance: Normal appearance.  HENT:     Head: Normocephalic.     Right Ear: Tympanic  membrane, ear canal and external ear normal.     Left Ear: Tympanic membrane, ear canal and external ear normal.     Nose: Congestion present. No rhinorrhea.     Mouth/Throat:     Mouth: Mucous membranes are moist.     Pharynx: Posterior oropharyngeal erythema present. No oropharyngeal exudate.  Eyes:     Extraocular Movements: Extraocular movements intact.     Conjunctiva/sclera: Conjunctivae normal.     Pupils: Pupils are equal, round, and reactive to light.  Cardiovascular:     Rate and Rhythm: Normal rate and regular rhythm.     Pulses: Normal pulses.     Heart sounds: Normal heart sounds.  Pulmonary:     Effort: Pulmonary effort is normal. No respiratory distress.     Breath sounds: Normal breath sounds. No stridor. No wheezing, rhonchi or rales.  Abdominal:     Palpations: Abdomen is soft.  Musculoskeletal:     Cervical back: Normal range of motion.  Lymphadenopathy:     Cervical: No cervical adenopathy.  Skin:    General: Skin is warm and dry.  Neurological:     General: No focal deficit present.     Mental Status: He is alert and oriented to person, place, and time.  Psychiatric:        Mood and Affect: Mood normal.        Behavior: Behavior normal.      UC Treatments / Results  Labs (all labs ordered are listed, but only abnormal results are displayed) Labs Reviewed  RESP PANEL BY RT-PCR (FLU A&B, COVID) ARPGX2  CULTURE, GROUP A STREP [facility])  POCT RAPID STREP A (OFFICE)    EKG   Radiology No results found.  Procedures Procedures (including critical care time)  Medications Ordered in UC Medications - No data to display  Initial Impression / Assessment and Plan / UC Course  I have reviewed the triage vital signs and the nursing notes.  Pertinent labs & imaging results that were available during my care of the patient were reviewed by me and considered in my medical decision making (see chart for details).  Patient presents with a 24-hour history of  upper respiratory symptoms.  On exam, his vital signs are stable, he is in no acute distress.  Rapid strep test is negative, throat culture and COVID/flu test are pending.  Symptoms appear to be consistent with a viral upper respiratory infection.  For patient's cough, he was prescribed Promethazine DM, and for his nasal congestion, fluticasone 50 mcg nasal spray was prescribed.  Discussed viral etiology with the patient.  Supportive care recommendations were also provided to the patient.  Patient is a candidate to receive Paxlovid if the COVID test is positive.  Patient verbalizes understanding.  All questions were answered.  Patient is stable for discharge. Final Clinical Impressions(s) / UC Diagnoses   Final diagnoses:  Viral upper respiratory tract infection with cough     Discharge Instructions      The rapid strep test was negative.  A throat culture and COVID/flu test are pending.  If the results of the pending test are positive, you will be contacted.  If you choose, you are a candidate to receive Paxlovid as an antiviral if the COVID test is positive. Take medication as prescribed. Increase fluids and allow for plenty of rest. Recommend Tylenol or ibuprofen as needed for pain, fever, or general discomfort. Warm salt water gargles 3-4 times daily to help with throat pain or discomfort. Recommend using a humidifier at bedtime during sleep to help with cough and nasal congestion. Sleep elevated on 2 pillows while cough symptoms persist. Follow-up with your primary care physician if symptoms do not improve over the next 10 to 14 days.     ED Prescriptions     Medication Sig Dispense Auth. Provider   promethazine-dextromethorphan (PROMETHAZINE-DM) 6.25-15 MG/5ML syrup Take 5 mLs by mouth 4 (four) times daily as needed for cough. 118 mL Angelyne Terwilliger-Warren, Sadie Haber, NP   fluticasone (FLONASE) 50 MCG/ACT nasal spray Place 2 sprays into both nostrils daily. 16 g Trimaine Maser-Warren, Sadie Haber, NP       PDMP not reviewed this encounter.   Abran Cantor, NP 06/30/22 1248

## 2022-07-01 ENCOUNTER — Emergency Department (HOSPITAL_COMMUNITY): Payer: Managed Care, Other (non HMO)

## 2022-07-01 ENCOUNTER — Encounter (HOSPITAL_COMMUNITY): Payer: Self-pay | Admitting: *Deleted

## 2022-07-01 ENCOUNTER — Emergency Department (HOSPITAL_COMMUNITY)
Admission: EM | Admit: 2022-07-01 | Discharge: 2022-07-01 | Disposition: A | Discharge status: Home / Self Care | Payer: Managed Care, Other (non HMO) | Attending: Emergency Medicine | Admitting: Emergency Medicine

## 2022-07-01 ENCOUNTER — Other Ambulatory Visit: Payer: Self-pay

## 2022-07-01 ENCOUNTER — Telehealth: Payer: Self-pay | Admitting: *Deleted

## 2022-07-01 DIAGNOSIS — U071 COVID-19: Secondary | ICD-10-CM | POA: Diagnosis not present

## 2022-07-01 DIAGNOSIS — R0602 Shortness of breath: Secondary | ICD-10-CM | POA: Diagnosis present

## 2022-07-01 LAB — RESP PANEL BY RT-PCR (FLU A&B, COVID) ARPGX2
Influenza A by PCR: NEGATIVE
Influenza B by PCR: NEGATIVE
SARS Coronavirus 2 by RT PCR: POSITIVE — AB

## 2022-07-01 LAB — CULTURE, GROUP A STREP (THRC)

## 2022-07-01 MED ORDER — IPRATROPIUM-ALBUTEROL 0.5-2.5 (3) MG/3ML IN SOLN
3.0000 mL | Freq: Once | RESPIRATORY_TRACT | Status: AC
  Administered 2022-07-01: 3 mL via RESPIRATORY_TRACT
  Filled 2022-07-01: qty 3

## 2022-07-01 MED ORDER — IBUPROFEN 800 MG PO TABS
800.0000 mg | ORAL_TABLET | Freq: Once | ORAL | Status: AC
  Administered 2022-07-01: 800 mg via ORAL
  Filled 2022-07-01: qty 1

## 2022-07-01 MED ORDER — PREDNISONE 10 MG PO TABS: 40.0000 mg | ORAL_TABLET | Freq: Every day | ORAL | 0 refills | Status: AC

## 2022-07-01 MED ORDER — METHYLPREDNISOLONE SODIUM SUCC 125 MG IJ SOLR
125.0000 mg | Freq: Once | INTRAMUSCULAR | Status: AC
  Administered 2022-07-01: 125 mg via INTRAVENOUS
  Filled 2022-07-01: qty 2

## 2022-07-01 MED ORDER — ALBUTEROL SULFATE HFA 108 (90 BASE) MCG/ACT IN AERS: 1.0000 | INHALATION_SPRAY | Freq: Four times a day (QID) | RESPIRATORY_TRACT | 0 refills | Status: AC | PRN

## 2022-07-01 MED ORDER — ALBUTEROL SULFATE HFA 108 (90 BASE) MCG/ACT IN AERS
2.0000 | INHALATION_SPRAY | Freq: Once | RESPIRATORY_TRACT | Status: AC
  Administered 2022-07-01: 2 via RESPIRATORY_TRACT
  Filled 2022-07-01: qty 6.7

## 2022-07-01 NOTE — ED Provider Notes (Signed)
[facility] EMERGENCY DEPARTMENT Provider Note   CSN: [id number] Arrival date & time: 07/01/22  1410     History  Chief Complaint  Patient presents with   Shortness of Breath   HPI Logan Lane is a 36 y.o. male for shortness of breath.  Symptoms started yesterday.  Was evaluated all urgent care and found to have COVID.  Also endorses associated wheezing.  Denies history of asthma.  Also mentioned that he has now had some chest pain which is worse with breathing and located all over his chest.  He used an inhaler yesterday but it did not help.  Also endorses a subjective fever. Endorses non-productive cough.    Shortness of Breath      Home Medications Prior to Admission medications   Medication Sig Start Date End Date Taking? Authorizing Provider  albuterol (VENTOLIN HFA) 108 (90 Base) MCG/ACT inhaler Inhale 1-2 puffs into the lungs every 6 (six) hours as needed for wheezing or shortness of breath. 07/01/22  Yes Gareth Eagle, PA-C  predniSONE (DELTASONE) 10 MG tablet Take 4 tablets (40 mg total) by mouth daily for 5 days. 07/01/22 07/06/22 Yes Gareth Eagle, PA-C  acetaminophen (TYLENOL) 500 MG tablet Take 1,000 mg by mouth every 6 (six) hours as needed for mild pain or moderate pain.    [provider]  benzonatate (TESSALON) 200 MG capsule Take 1 capsule (200 mg total) by mouth 3 (three) times daily as needed for cough. 12/30/14   Janne Napoleon, NP  fluticasone (FLONASE) 50 MCG/ACT nasal spray Place 2 sprays into both nostrils daily. 06/30/22   Leath-Warren, Sadie Haber, NP  guaiFENesin-codeine (ROBITUSSIN AC) 100-10 MG/5ML syrup Take 5 mLs by mouth 3 (three) times daily as needed for cough. 12/30/14   Janne Napoleon, NP  ibuprofen (ADVIL,MOTRIN) 200 MG tablet Take 400 mg by mouth every 6 (six) hours as needed for mild pain or moderate pain.    [provider]  ondansetron (ZOFRAN) 4 MG tablet Take 1 tablet (4 mg total) by mouth every 6 (six) hours. As  needed for nausea vomiting 10/01/21   Triplett, Tammy, PA-C  oxyCODONE-acetaminophen (PERCOCET/ROXICET) 5-325 MG per tablet Take 1 tablet by mouth every 4 (four) hours as needed for severe pain. 07/21/13   Dione Booze, MD  pantoprazole (PROTONIX) 40 MG tablet Take 1 tablet (40 mg total) by mouth daily. 10/01/21   Triplett, Tammy, PA-C  promethazine (PHENERGAN) 25 MG tablet Take 1 tablet (25 mg total) by mouth every 6 (six) hours as needed (headache). 06/26/16   Gilda Crease, MD  promethazine-dextromethorphan (PROMETHAZINE-DM) 6.25-15 MG/5ML syrup Take 5 mLs by mouth 4 (four) times daily as needed for cough. 06/30/22   Leath-Warren, Sadie Haber, NP      Allergies    Patient has no known allergies.    Review of Systems   Review of Systems  Respiratory:  Positive for shortness of breath.     Physical Exam Updated Vital Signs BP 129/87   Pulse 92   Temp 98.8 F (37.1 C) (Oral)   Resp 18   Ht 6' (1.829 m)   Wt 63 kg   SpO2 100%   BMI 18.85 kg/m  Physical Exam Vitals and nursing note reviewed.  HENT:     Head: Normocephalic and atraumatic.     Mouth/Throat:     Mouth: Mucous membranes are moist.  Eyes:     General:        Right eye: No discharge.  Left eye: No discharge.     Conjunctiva/sclera: Conjunctivae normal.  Cardiovascular:     Rate and Rhythm: Normal rate and regular rhythm.     Pulses: Normal pulses.     Heart sounds: Normal heart sounds.  Pulmonary:     Effort: Pulmonary effort is normal.     Breath sounds: Rhonchi present. No wheezing or rales.  Abdominal:     General: Abdomen is flat.     Palpations: Abdomen is soft.  Skin:    General: Skin is warm and dry.  Neurological:     General: No focal deficit present.  Psychiatric:        Mood and Affect: Mood normal.     ED Results / Procedures / Treatments   Labs (all labs ordered are listed, but only abnormal results are displayed) Labs Reviewed  RESP PANEL BY RT-PCR (FLU A&B, COVID) ARPGX2 -  Abnormal; Notable for the following components:      Result Value   SARS Coronavirus 2 by RT PCR POSITIVE (*)    All other components within normal limits    EKG None  Radiology DG Chest 2 View  Result Date: 07/01/2022 CLINICAL DATA:  Shortness of breath, positive COVID test EXAM: CHEST - 2 VIEW COMPARISON:  Chest x-ray October 01, 2021 FINDINGS: The cardiomediastinal silhouette is unchanged in contour. No focal pulmonary opacity. No pleural effusion or pneumothorax. The visualized upper abdomen is unremarkable. No acute osseous abnormality. IMPRESSION: No acute cardiopulmonary abnormality. Electronically Signed   By: Jacob Moores M.D.   On: 07/01/2022 16:25    Procedures Procedures    Medications Ordered in ED Medications  ibuprofen (ADVIL) tablet 800 mg (has no administration in time range)  albuterol (VENTOLIN HFA) 108 (90 Base) MCG/ACT inhaler 2 puff (has no administration in time range)  ipratropium-albuterol (DUONEB) 0.5-2.5 (3) MG/3ML nebulizer solution 3 mL (3 mLs Nebulization Given 07/01/22 1656)  methylPREDNISolone sodium succinate (SOLU-MEDROL) 125 mg/2 mL injection 125 mg (125 mg Intravenous Given 07/01/22 1757)    ED Course/ Medical Decision Making/ A&P                           Medical Decision Making Amount and/or Complexity of Data Reviewed Radiology: ordered.  Risk Prescription drug management.   This patient presents to the ED for concern of shortness of breath, this involves a number of treatment options, and is a complaint that carries with it a high risk of complications and morbidity.  The differential diagnosis includes asthma exacerbation, ACS, symptoms related to COVID, and pneumonia.   Co morbidities: Discussed in HPI    EMR reviewed including pt PMHx, past surgical history and past visits to ER.   See HPI for more details   Lab Tests:   No labs ordered   Imaging Studies:  NAD. I personally reviewed all imaging studies and no  acute abnormality found. I agree with radiology interpretation.    Cardiac Monitoring:  The patient was maintained on a cardiac monitor.  I personally viewed and interpreted the cardiac monitored which showed an underlying rhythm of: NSR EKG non-ischemic   Medicines ordered:  I ordered medication including Solu-Medrol and DuoNeb for shortness of breath.  Ibuprofen for pain Reevaluation of the patient after these medicines showed that the patient improved I have reviewed the patients home medicines and have made adjustments as needed      Consults/Attending Physician   I discussed this case with my attending  physician who cosigned this note including patient's presenting symptoms, physical exam, and planned diagnostics and interventions. Attending physician stated agreement with plan or made changes to plan which were implemented.   Reevaluation:  After the interventions noted above I re-evaluated patient and found that they have :improved    Problem List / ED Course:  Patient presented for shortness of breath setting of recent diagnosis for COVID.  On exam he was rhonchorous but could not hear wheezing or any other adventitious lungs sounds.  Treated with albuterol and IV Solu-Medrol.  Treated with ibuprofen for symptomatic relief.  Symptoms are likely related to his ongoing COVID infection.  Did consider asthma exacerbation but unlikely given no wheezing and not tachypneic.  Also consider ACS since he did endorse chest pain.  Chest pain was reproducible on exam and EKG was nonischemic making ACS unlikely.  Doubt pneumonia given negative x-ray.  Upon reevaluation after treatment patient stated he felt much better.  Prescribed albuterol and 5-day course of prednisone.  Advised to follow-up with PCP if symptoms were persistent.  Discussed return precautions.   Dispostion:  After consideration of the diagnostic results and the patients response to treatment, I feel that the  patient would benefit from discharge and conservative treatment for COVID infection and possible follow-up with PCP if symptoms do not abate within the next 5 days.         Final Clinical Impression(s) / ED Diagnoses Final diagnoses:  SOB (shortness of breath)  COVID    Rx / DC Orders ED Discharge Orders          Ordered    predniSONE (DELTASONE) 10 MG tablet  Daily        07/01/22 1943    albuterol (VENTOLIN HFA) 108 (90 Base) MCG/ACT inhaler  Every 6 hours PRN        07/01/22 1944              Gareth Eagle, PA-C 07/01/22 1944    Bethann Berkshire, MD 07/03/22 1141

## 2022-07-01 NOTE — ED Triage Notes (Signed)
Pt with positive Covid test yesterday.  Pt states he has wheezing that started today.

## 2022-07-01 NOTE — Discharge Instructions (Signed)
Evaluation for your shortness of breath revealed that you still have an ongoing COVID infection.  It is likely the cause of your symptoms at this time.  Recommend that you take 5-day course of prednisone to help with airway inflammation.  Also can use your albuterol inhaler for breath.  Recommend that you follow-up with your PCP if your symptoms continue longer than 5 days.  If you have worsening shortness of breath, worsening chest pain, cannot swallow, or begin drooling or any other concerning symptom please return to the emergency department for further evaluation.

## 2022-07-01 NOTE — Telephone Encounter (Signed)
T/C from pt's wife stating concern for pt bc "he is miserable", wondering if there is anything that can be done for positive Covid result. No exchange of information by RN made with spouse RE: pt's medical info -- informed spouse I would need to get permission from pt to discuss anything with her; spouse states "he will not talk on the phone; I tried to call him earlier, and he just grunts and will not talk". Asked spouse if pt is not talking due to difficulty breathing, and spouse affirms multiple times. She states the difficulty breathing isn't any worse than yesterday, but reiterates that pt will not speak due to difficulty breathing. Informed spouse that pt needs to go to ED immediately. Spouse verbalized understanding.

## 2022-08-05 ENCOUNTER — Ambulatory Visit
Admission: EM | Admit: 2022-08-05 | Discharge: 2022-08-05 | Disposition: A | Discharge status: Home / Self Care | Payer: Managed Care, Other (non HMO) | Attending: Family Medicine | Admitting: Family Medicine

## 2022-08-05 DIAGNOSIS — J209 Acute bronchitis, unspecified: Secondary | ICD-10-CM

## 2022-08-05 MED ORDER — PREDNISONE 20 MG PO TABS: 40.0000 mg | ORAL_TABLET | Freq: Every day | ORAL | 0 refills | Status: DC

## 2022-08-05 NOTE — Discharge Instructions (Signed)
Take the albuterol inhaler every 4 hours as needed and use the cough syrup as needed.  Take Mucinex twice daily as well.  I have sent over a steroid course to help with your symptoms

## 2022-08-05 NOTE — ED Triage Notes (Signed)
Pt reports he "can't shake the stuff in his chest" hurt when he coughs x 1 week ago.  Took nyquil dayquil, mucinex, sudafed but no relief.

## 2022-08-05 NOTE — ED Provider Notes (Signed)
[facility]    CSN: [id number] Arrival date & time: 08/05/22  5852      History   Chief Complaint No chief complaint on file.   HPI Logan Lane is a 36 y.o. male.   Presenting today with with what initially started as cold symptoms and is now a progressively worsening productive cough, chest tightness, occasional wheezes.  Denies fever, chills, chest pain, shortness of breath, abdominal pain, nausea vomiting or diarrhea.  Tried multiple over-the-counter cold congestion medications with no relief.  Had COVID a month ago and was given prednisone and an inhaler, still has the inhaler at home but not currently using it.  States the prednisone helped him a lot last month.  No known chronic pulmonary disease.    History reviewed. No pertinent past medical history.  Patient Active Problem List   Diagnosis Date Noted   Head injury 12/02/2019    History reviewed. No pertinent surgical history.     Home Medications    Prior to Admission medications   Medication Sig Start Date End Date Taking? Authorizing Provider  predniSONE (DELTASONE) 20 MG tablet Take 2 tablets (40 mg total) by mouth daily with breakfast. 08/05/22  Yes Particia Nearing, PA-C  acetaminophen (TYLENOL) 500 MG tablet Take 1,000 mg by mouth every 6 (six) hours as needed for mild pain or moderate pain.    [provider]  albuterol (VENTOLIN HFA) 108 (90 Base) MCG/ACT inhaler Inhale 1-2 puffs into the lungs every 6 (six) hours as needed for wheezing or shortness of breath. 07/01/22   Gareth Eagle, PA-C  benzonatate (TESSALON) 200 MG capsule Take 1 capsule (200 mg total) by mouth 3 (three) times daily as needed for cough. 12/30/14   Janne Napoleon, NP  fluticasone (FLONASE) 50 MCG/ACT nasal spray Place 2 sprays into both nostrils daily. 06/30/22   Leath-Warren, Sadie Haber, NP  guaiFENesin-codeine (ROBITUSSIN AC) 100-10 MG/5ML syrup Take 5 mLs by mouth 3 (three) times daily as needed for  cough. 12/30/14   Janne Napoleon, NP  ibuprofen (ADVIL,MOTRIN) 200 MG tablet Take 400 mg by mouth every 6 (six) hours as needed for mild pain or moderate pain.    [provider]  ondansetron (ZOFRAN) 4 MG tablet Take 1 tablet (4 mg total) by mouth every 6 (six) hours. As needed for nausea vomiting 10/01/21   Triplett, Tammy, PA-C  oxyCODONE-acetaminophen (PERCOCET/ROXICET) 5-325 MG per tablet Take 1 tablet by mouth every 4 (four) hours as needed for severe pain. 07/21/13   Dione Booze, MD  pantoprazole (PROTONIX) 40 MG tablet Take 1 tablet (40 mg total) by mouth daily. 10/01/21   Triplett, Tammy, PA-C  promethazine (PHENERGAN) 25 MG tablet Take 1 tablet (25 mg total) by mouth every 6 (six) hours as needed (headache). 06/26/16   Gilda Crease, MD  promethazine-dextromethorphan (PROMETHAZINE-DM) 6.25-15 MG/5ML syrup Take 5 mLs by mouth 4 (four) times daily as needed for cough. 06/30/22   Leath-Warren, Sadie Haber, NP    Family History History reviewed. No pertinent family history.  Social History Social History   Tobacco Use   Smoking status: Every Day    Packs/day: 0.50    Types: Cigarettes   Smokeless tobacco: Never  Vaping Use   Vaping Use: Never used  Substance Use Topics   Alcohol use: Not Currently   Drug use: No     Allergies   Patient has no known allergies.   Review of Systems Review of Systems HPI  Physical Exam Triage Vital Signs ED Triage Vitals  Enc Vitals Group     BP 08/05/22 0854 119/82     Pulse Rate 08/05/22 0854 96     Resp 08/05/22 0854 18     Temp 08/05/22 0854 98.3 F (36.8 C)     Temp Source 08/05/22 0854 Oral     SpO2 08/05/22 0854 96 %     Weight --      Height --      Head Circumference --      Peak Flow --      Pain Score 08/05/22 0857 0     Pain Loc --      Pain Edu? --      Excl. in GC? --    No data found.  Updated Vital Signs BP 119/82   Pulse 96   Temp 98.3 F (36.8 C) (Oral)   Resp 18   SpO2 96%   Visual  Acuity Right Eye Distance:   Left Eye Distance:   Bilateral Distance:    Right Eye Near:   Left Eye Near:    Bilateral Near:     Physical Exam Vitals and nursing note reviewed.  Constitutional:      Appearance: He is well-developed.  HENT:     Head: Atraumatic.     Right Ear: External ear normal.     Left Ear: External ear normal.     Nose: Rhinorrhea present.     Mouth/Throat:     Pharynx: Posterior oropharyngeal erythema present. No oropharyngeal exudate.  Eyes:     Conjunctiva/sclera: Conjunctivae normal.     Pupils: Pupils are equal, round, and reactive to light.  Cardiovascular:     Rate and Rhythm: Normal rate and regular rhythm.  Pulmonary:     Effort: Pulmonary effort is normal. No respiratory distress.     Breath sounds: Wheezing present. No rales.  Musculoskeletal:        General: Normal range of motion.     Cervical back: Normal range of motion and neck supple.  Lymphadenopathy:     Cervical: No cervical adenopathy.  Skin:    General: Skin is warm and dry.  Neurological:     Mental Status: He is alert and oriented to person, place, and time.  Psychiatric:        Behavior: Behavior normal.      UC Treatments / Results  Labs (all labs ordered are listed, but only abnormal results are displayed) Labs Reviewed - No data to display  EKG   Radiology No results found.  Procedures Procedures (including critical care time)  Medications Ordered in UC Medications - No data to display  Initial Impression / Assessment and Plan / UC Course  I have reviewed the triage vital signs and the nursing notes.  Pertinent labs & imaging results that were available during my care of the patient were reviewed by me and considered in my medical decision making (see chart for details).     States he already has albuterol and Phenergan DM cough syrup at home, will add a course of prednisone and discussed continued supportive medications and home care.  Return for  worsening symptoms.  Final Clinical Impressions(s) / UC Diagnoses   Final diagnoses:  Acute bronchitis, unspecified organism     Discharge Instructions      Take the albuterol inhaler every 4 hours as needed and use the cough syrup as needed.  Take Mucinex twice daily as well.  I have  sent over a steroid course to help with your symptoms    ED Prescriptions     Medication Sig Dispense Auth. Provider   predniSONE (DELTASONE) 20 MG tablet Take 2 tablets (40 mg total) by mouth daily with breakfast. 10 tablet Particia Nearing, [place]      PDMP not reviewed this encounter.   Roosvelt Maser [place], [place] 08/05/22 [phone]

## 2022-08-16 ENCOUNTER — Ambulatory Visit
Admission: EM | Admit: 2022-08-16 | Discharge: 2022-08-16 | Disposition: A | Discharge status: Home / Self Care | Payer: Managed Care, Other (non HMO) | Attending: Nurse Practitioner | Admitting: Nurse Practitioner

## 2022-08-16 ENCOUNTER — Encounter: Payer: Self-pay | Admitting: Emergency Medicine

## 2022-08-16 DIAGNOSIS — J029 Acute pharyngitis, unspecified: Secondary | ICD-10-CM

## 2022-08-16 DIAGNOSIS — Z1152 Encounter for screening for COVID-19: Secondary | ICD-10-CM | POA: Diagnosis not present

## 2022-08-16 DIAGNOSIS — R6889 Other general symptoms and signs: Secondary | ICD-10-CM | POA: Insufficient documentation

## 2022-08-16 LAB — POCT RAPID STREP A (OFFICE): Rapid Strep A Screen: NEGATIVE

## 2022-08-16 MED ORDER — OSELTAMIVIR PHOSPHATE 75 MG PO CAPS: 75.0000 mg | ORAL_CAPSULE | Freq: Two times a day (BID) | ORAL | 0 refills | Status: AC

## 2022-08-16 MED ORDER — LIDOCAINE VISCOUS HCL 2 % MT SOLN: 5.0000 mL | Freq: Four times a day (QID) | OROMUCOSAL | 0 refills | Status: AC | PRN

## 2022-08-16 MED ORDER — ONDANSETRON 4 MG PO TBDP: 4.0000 mg | ORAL_TABLET | Freq: Three times a day (TID) | ORAL | 0 refills | Status: AC | PRN

## 2022-08-16 NOTE — Discharge Instructions (Addendum)
The rapid strep test is negative.COVID test is pending. You will be contacted if the pending test results are positive. If your COVID test is positive, you are a candidate to receive Paxlovid. Take medication as prescribed. Increase fluids and allow for plenty of rest. May take over-the-counter Tylenol or Ibuprofen as needed for pain, fever, or general discomfort. Warm salt water gargles 3-4 times daily while throat pain persist. Recommend using a humidifier in your bedroom at nighttime during sleep and sleeping elevated on pillows if cough symptoms develop. Recommend a brat diet while nausea and vomiting persist.  Fluids bananas, rice, applesauce, and toast. Advised that a viral illness may last anywhere from 10 to 14 days.  If your symptoms worsen before that time, or extend beyond that time, please follow-up in this clinic or with your primary care physician for further evaluation. Follow-up as needed.

## 2022-08-16 NOTE — ED Provider Notes (Signed)
[facility]    CSN: [id number] Arrival date & time: 08/16/22  0809      History   Chief Complaint No chief complaint on file.   HPI Logan Lane is a 36 y.o. male.   The history is provided by the patient.   The patient presents for flulike symptoms that started over the past 24 hours.  Patient complains of fever, body aches, sore throat, headache, ear pressure and fullness, and vomiting.  Patient denies cough, abdominal pain, or diarrhea.  Patient reports that his son tested positive for influenza A over the past several days.  Patient states that he has been taking over-the-counter cough and cold medicine for his symptoms such as DayQuil.    History reviewed. No pertinent past medical history.  Patient Active Problem List   Diagnosis Date Noted   Head injury 12/02/2019    History reviewed. No pertinent surgical history.     Home Medications    Prior to Admission medications   Medication Sig Start Date End Date Taking? Authorizing Provider  lidocaine (XYLOCAINE) 2 % solution Use as directed 5 mLs in the mouth or throat every 6 (six) hours as needed for mouth pain. 08/16/22  Yes Oleta Gunnoe-Warren, Sadie Haber, NP  ondansetron (ZOFRAN-ODT) 4 MG disintegrating tablet Take 1 tablet (4 mg total) by mouth every 8 (eight) hours as needed for nausea or vomiting. 08/16/22  Yes Vaden Becherer-Warren, Sadie Haber, NP  oseltamivir (TAMIFLU) 75 MG capsule Take 1 capsule (75 mg total) by mouth every 12 (twelve) hours. 08/16/22  Yes Kellon Chalk-Warren, Sadie Haber, NP  acetaminophen (TYLENOL) 500 MG tablet Take 1,000 mg by mouth every 6 (six) hours as needed for mild pain or moderate pain.    [provider]  albuterol (VENTOLIN HFA) 108 (90 Base) MCG/ACT inhaler Inhale 1-2 puffs into the lungs every 6 (six) hours as needed for wheezing or shortness of breath. 07/01/22   Gareth Eagle, PA-C  benzonatate (TESSALON) 200 MG capsule Take 1 capsule (200 mg total) by mouth 3 (three)  times daily as needed for cough. 12/30/14   Janne Napoleon, NP  fluticasone (FLONASE) 50 MCG/ACT nasal spray Place 2 sprays into both nostrils daily. 06/30/22   Shamonique Battiste-Warren, Sadie Haber, NP  guaiFENesin-codeine (ROBITUSSIN AC) 100-10 MG/5ML syrup Take 5 mLs by mouth 3 (three) times daily as needed for cough. 12/30/14   Janne Napoleon, NP  ibuprofen (ADVIL,MOTRIN) 200 MG tablet Take 400 mg by mouth every 6 (six) hours as needed for mild pain or moderate pain.    [provider]  ondansetron (ZOFRAN) 4 MG tablet Take 1 tablet (4 mg total) by mouth every 6 (six) hours. As needed for nausea vomiting 10/01/21   Triplett, Tammy, PA-C  oxyCODONE-acetaminophen (PERCOCET/ROXICET) 5-325 MG per tablet Take 1 tablet by mouth every 4 (four) hours as needed for severe pain. 07/21/13   Dione Booze, MD  pantoprazole (PROTONIX) 40 MG tablet Take 1 tablet (40 mg total) by mouth daily. 10/01/21   Triplett, Tammy, PA-C  predniSONE (DELTASONE) 20 MG tablet Take 2 tablets (40 mg total) by mouth daily with breakfast. 08/05/22   Particia Nearing, PA-C  promethazine (PHENERGAN) 25 MG tablet Take 1 tablet (25 mg total) by mouth every 6 (six) hours as needed (headache). 06/26/16   Gilda Crease, MD  promethazine-dextromethorphan (PROMETHAZINE-DM) 6.25-15 MG/5ML syrup Take 5 mLs by mouth 4 (four) times daily as needed for cough. 06/30/22   Maricus Tanzi-Warren, Sadie Haber, NP    Family  History History reviewed. No pertinent family history.  Social History Social History   Tobacco Use   Smoking status: Former    Packs/day: 0.50    Types: Cigarettes   Smokeless tobacco: Never  Vaping Use   Vaping Use: Never used  Substance Use Topics   Alcohol use: Not Currently   Drug use: No     Allergies   Patient has no known allergies.   Review of Systems Review of Systems Per HPI  Physical Exam Triage Vital Signs ED Triage Vitals  Enc Vitals Group     BP 08/16/22 0841 127/82     Pulse Rate 08/16/22 0841  (!) 107     Resp 08/16/22 0841 18     Temp 08/16/22 0841 98.7 F (37.1 C)     Temp Source 08/16/22 0841 Oral     SpO2 08/16/22 0841 97 %     Weight --      Height --      Head Circumference --      Peak Flow --      Pain Score 08/16/22 0842 10     Pain Loc --      Pain Edu? --      Excl. in GC? --    No data found.  Updated Vital Signs BP 127/82 (BP Location: Right Arm)   Pulse (!) 107   Temp 98.7 F (37.1 C) (Oral)   Resp 18   SpO2 97%   Visual Acuity Right Eye Distance:   Left Eye Distance:   Bilateral Distance:    Right Eye Near:   Left Eye Near:    Bilateral Near:     Physical Exam Vitals and nursing note reviewed.  Constitutional:      General: He is not in acute distress.    Appearance: Normal appearance.  HENT:     Head: Normocephalic.     Right Ear: Tympanic membrane, ear canal and external ear normal.     Left Ear: Tympanic membrane, ear canal and external ear normal.     Nose: Congestion present.     Right Turbinates: Enlarged and swollen.     Left Turbinates: Enlarged and swollen.     Right Sinus: No maxillary sinus tenderness or frontal sinus tenderness.     Left Sinus: No maxillary sinus tenderness or frontal sinus tenderness.     Mouth/Throat:     Lips: Pink.     Mouth: Mucous membranes are moist.     Pharynx: Uvula midline. Pharyngeal swelling and posterior oropharyngeal erythema present.     Tonsils: 1+ on the right. 1+ on the left.  Eyes:     Extraocular Movements: Extraocular movements intact.     Conjunctiva/sclera: Conjunctivae normal.     Pupils: Pupils are equal, round, and reactive to light.  Cardiovascular:     Rate and Rhythm: Normal rate and regular rhythm.     Pulses: Normal pulses.     Heart sounds: Normal heart sounds.  Pulmonary:     Effort: Pulmonary effort is normal. No respiratory distress.     Breath sounds: Normal breath sounds. No stridor. No wheezing, rhonchi or rales.  Abdominal:     General: Bowel sounds are  normal.     Palpations: Abdomen is soft.     Tenderness: There is no abdominal tenderness.  Musculoskeletal:     Cervical back: Normal range of motion.  Lymphadenopathy:     Cervical: No cervical adenopathy.  Skin:    General: Skin  is warm and dry.  Neurological:     General: No focal deficit present.     Mental Status: He is alert and oriented to person, place, and time.  Psychiatric:        Mood and Affect: Mood normal.        Behavior: Behavior normal.      UC Treatments / Results  Labs (all labs ordered are listed, but only abnormal results are displayed) Labs Reviewed  SARS CORONAVIRUS 2 (TAT 6-24 HRS)  POCT RAPID STREP A (OFFICE)    EKG   Radiology No results found.  Procedures Procedures (including critical care time)  Medications Ordered in UC Medications - No data to display  Initial Impression / Assessment and Plan / UC Course  I have reviewed the triage vital signs and the nursing notes.  Pertinent labs & imaging results that were available during my care of the patient were reviewed by me and considered in my medical decision making (see chart for details).  The patient is well-appearing, he is in no acute distress, vital signs are stable, although he is mildly tachycardic.  Symptoms are consistent with a viral illness, most likely influenza.  COVID test is pending.  If patient's COVID test is positive, he is a candidate to receive Paxlovid. Unable to provide testing due to unavailability of resources.  Will treat patient with Tamiflu 75 mg, ondansetron 4 mg for his nausea and vomiting, and viscous lidocaine 2% for throat pain and discomfort.  Supportive care recommendations were provided to the patient to include increasing fluids, allowing for plenty of rest, and continuing Tylenol or ibuprofen for pain, fever, general discomfort.  Discussed viral etiology with the patient and when follow-up may be necessary.  Patient verbalizes understanding.  All  questions were answered.  Patient stable for discharge.  Work note was provided.   Final Clinical Impressions(s) / UC Diagnoses   Final diagnoses:  Flu-like symptoms  Encounter for screening for COVID-19     Discharge Instructions      The rapid strep test is negative.COVID test is pending. You will be contacted if the pending test results are positive. If your COVID test is positive, you are a candidate to receive Paxlovid. Take medication as prescribed. Increase fluids and allow for plenty of rest. May take over-the-counter Tylenol or Ibuprofen as needed for pain, fever, or general discomfort. Warm salt water gargles 3-4 times daily while throat pain persist. Recommend using a humidifier in your bedroom at nighttime during sleep and sleeping elevated on pillows if cough symptoms develop. Recommend a brat diet while nausea and vomiting persist.  Fluids bananas, rice, applesauce, and toast. Advised that a viral illness may last anywhere from 10 to 14 days.  If your symptoms worsen before that time, or extend beyond that time, please follow-up in this clinic or with your primary care physician for further evaluation. Follow-up as needed.     ED Prescriptions     Medication Sig Dispense Auth. Provider   oseltamivir (TAMIFLU) 75 MG capsule Take 1 capsule (75 mg total) by mouth every 12 (twelve) hours. 10 capsule Marino Rogerson-Warren, Sadie Haber, NP   ondansetron (ZOFRAN-ODT) 4 MG disintegrating tablet Take 1 tablet (4 mg total) by mouth every 8 (eight) hours as needed for nausea or vomiting. 20 tablet Oryn Casanova-Warren, Sadie Haber, NP   lidocaine (XYLOCAINE) 2 % solution Use as directed 5 mLs in the mouth or throat every 6 (six) hours as needed for mouth pain. 100 mL Estefany Goebel-Warren, Lorene Dy  J, NP      PDMP not reviewed this encounter.   Abran CantorLeath-Warren, Likisha Alles J, NP 08/16/22 [phone]

## 2022-08-16 NOTE — ED Triage Notes (Signed)
Body aches, throat burning, vomiting, headache since yesterday.  Has been taking dayquil without relief.

## 2022-08-17 LAB — SARS CORONAVIRUS 2 (TAT 6-24 HRS): SARS Coronavirus 2: NEGATIVE

## 2022-08-25 ENCOUNTER — Ambulatory Visit
Admission: EM | Admit: 2022-08-25 | Discharge: 2022-08-25 | Disposition: A | Discharge status: Home / Self Care | Payer: Managed Care, Other (non HMO) | Attending: Nurse Practitioner | Admitting: Nurse Practitioner

## 2022-08-25 DIAGNOSIS — J029 Acute pharyngitis, unspecified: Secondary | ICD-10-CM | POA: Diagnosis not present

## 2022-08-25 DIAGNOSIS — Z20818 Contact with and (suspected) exposure to other bacterial communicable diseases: Secondary | ICD-10-CM

## 2022-08-25 LAB — POCT RAPID STREP A (OFFICE): Rapid Strep A Screen: NEGATIVE

## 2022-08-25 MED ORDER — AMOXICILLIN 500 MG PO CAPS: 500.0000 mg | ORAL_CAPSULE | Freq: Two times a day (BID) | ORAL | 0 refills | Status: AC
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
## 2022-08-25 NOTE — Discharge Instructions (Signed)
The rapid strep test was negative.  Throat culture is pending.  You will be contacted if the pending test results of the culture are positive, or if it is indicated that you need to stop the antibiotic prescribed today. May take over-the-counter Tylenol or ibuprofen as needed for pain, fever, general discomfort. Warm salt water gargles 3-4 times daily while symptoms persist. Discard your toothbrush after 3 days. If symptoms fail to improve after this treatment, or if your culture results are negative, please follow-up with your primary care physician for further evaluation. Follow-up as needed.

## 2022-08-25 NOTE — ED Triage Notes (Signed)
Pt reports swelling in glands around neck, soreness, chills, body aches x 4 days. Reports he had Flu 1 week ago.

## 2023-05-04 ENCOUNTER — Other Ambulatory Visit: Payer: Self-pay

## 2023-05-04 ENCOUNTER — Emergency Department (HOSPITAL_COMMUNITY): Payer: Managed Care, Other (non HMO)

## 2023-05-04 ENCOUNTER — Emergency Department (HOSPITAL_COMMUNITY)
Admission: EM | Admit: 2023-05-04 | Discharge: 2023-05-04 | Disposition: A | Discharge status: Home / Self Care | Payer: Managed Care, Other (non HMO) | Attending: Emergency Medicine | Admitting: Emergency Medicine

## 2023-05-04 ENCOUNTER — Encounter (HOSPITAL_COMMUNITY): Payer: Self-pay

## 2023-05-04 DIAGNOSIS — S2231XA Fracture of one rib, right side, initial encounter for closed fracture: Secondary | ICD-10-CM | POA: Diagnosis not present

## 2023-05-04 DIAGNOSIS — R0602 Shortness of breath: Secondary | ICD-10-CM | POA: Insufficient documentation

## 2023-05-04 DIAGNOSIS — M25511 Pain in right shoulder: Secondary | ICD-10-CM | POA: Diagnosis not present

## 2023-05-04 DIAGNOSIS — T17800A Unspecified foreign body in other parts of respiratory tract causing asphyxiation, initial encounter: Secondary | ICD-10-CM

## 2023-05-04 DIAGNOSIS — Y92410 Unspecified street and highway as the place of occurrence of the external cause: Secondary | ICD-10-CM | POA: Diagnosis not present

## 2023-05-04 DIAGNOSIS — J069 Acute upper respiratory infection, unspecified: Secondary | ICD-10-CM | POA: Diagnosis not present

## 2023-05-04 DIAGNOSIS — T17900A Unspecified foreign body in respiratory tract, part unspecified causing asphyxiation, initial encounter: Secondary | ICD-10-CM | POA: Insufficient documentation

## 2023-05-04 DIAGNOSIS — S299XXA Unspecified injury of thorax, initial encounter: Secondary | ICD-10-CM | POA: Diagnosis present

## 2023-05-04 LAB — CBC WITH DIFFERENTIAL/PLATELET
Abs Immature Granulocytes: 0.05 10*3/uL (ref 0.00–0.07)
Basophils Absolute: 0.1 10*3/uL (ref 0.0–0.1)
Basophils Relative: 1 %
Eosinophils Absolute: 0 10*3/uL (ref 0.0–0.5)
Eosinophils Relative: 0 %
HCT: 47 % (ref 39.0–52.0)
Hemoglobin: 16.1 g/dL (ref 13.0–17.0)
Immature Granulocytes: 0 %
Lymphocytes Relative: 26 %
Lymphs Abs: 3.8 10*3/uL (ref 0.7–4.0)
MCH: 29.3 pg (ref 26.0–34.0)
MCHC: 34.3 g/dL (ref 30.0–36.0)
MCV: 85.5 fL (ref 80.0–100.0)
Monocytes Absolute: 1.2 10*3/uL — ABNORMAL HIGH (ref 0.1–1.0)
Monocytes Relative: 8 %
Neutro Abs: 9.5 10*3/uL — ABNORMAL HIGH (ref 1.7–7.7)
Neutrophils Relative %: 65 %
Platelets: 267 10*3/uL (ref 150–400)
RBC: 5.5 MIL/uL (ref 4.22–5.81)
RDW: 13.1 % (ref 11.5–15.5)
WBC: 14.6 10*3/uL — ABNORMAL HIGH (ref 4.0–10.5)
nRBC: 0 % (ref 0.0–0.2)

## 2023-05-04 LAB — COMPREHENSIVE METABOLIC PANEL
ALT: 46 U/L — ABNORMAL HIGH (ref 0–44)
AST: 41 U/L (ref 15–41)
Albumin: 4.7 g/dL (ref 3.5–5.0)
Alkaline Phosphatase: 77 U/L (ref 38–126)
Anion gap: 13 (ref 5–15)
BUN: 15 mg/dL (ref 6–20)
CO2: 24 mmol/L (ref 22–32)
Calcium: 9.5 mg/dL (ref 8.9–10.3)
Chloride: 100 mmol/L (ref 98–111)
Creatinine, Ser: 1.21 mg/dL (ref 0.61–1.24)
GFR, Estimated: >60 mL/min (ref >60)
Glucose, Bld: 129 mg/dL — ABNORMAL HIGH (ref 70–99)
Potassium: 4.3 mmol/L (ref 3.5–5.1)
Sodium: 137 mmol/L (ref 135–145)
Total Bilirubin: 0.8 mg/dL (ref 0.3–1.2)
Total Protein: 7.6 g/dL (ref 6.5–8.1)

## 2023-05-04 MED ORDER — MORPHINE SULFATE (PF) 4 MG/ML IV SOLN
4.0000 mg | Freq: Once | INTRAVENOUS | Status: AC
  Administered 2023-05-04: 4 mg via INTRAVENOUS
  Filled 2023-05-04: qty 1

## 2023-05-04 MED ORDER — AMOXICILLIN 250 MG PO CAPS
1000.0000 mg | ORAL_CAPSULE | Freq: Once | ORAL | Status: AC
  Administered 2023-05-04: 1000 mg via ORAL
  Filled 2023-05-04: qty 4

## 2023-05-04 MED ORDER — IOHEXOL 300 MG/ML  SOLN
100.0000 mL | Freq: Once | INTRAMUSCULAR | Status: AC | PRN
  Administered 2023-05-04: 100 mL via INTRAVENOUS

## 2023-05-04 MED ORDER — AMOXICILLIN 500 MG PO CAPS
1000.0000 mg | ORAL_CAPSULE | Freq: Three times a day (TID) | ORAL | 0 refills | Status: AC
Start: 2023-05-04 — End: 2023-05-11

## 2023-05-04 MED ORDER — OXYCODONE-ACETAMINOPHEN 5-325 MG PO TABS
1.0000 | ORAL_TABLET | Freq: Four times a day (QID) | ORAL | 0 refills | Status: AC | PRN
Start: 2023-05-04 — End: ?

## 2023-05-04 MED ORDER — OXYCODONE-ACETAMINOPHEN 5-325 MG PO TABS
1.0000 | ORAL_TABLET | Freq: Four times a day (QID) | ORAL | 0 refills | Status: DC | PRN
Start: 2023-05-04 — End: 2023-05-10

## 2023-05-04 NOTE — ED Notes (Addendum)
Patient transported to X-ray via wheelchair
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
## 2023-05-06 MED FILL — Oxycodone w/ Acetaminophen Tab 5-325 MG: ORAL | Qty: 6 | Status: AC

## 2023-05-10 ENCOUNTER — Other Ambulatory Visit: Payer: Self-pay

## 2023-05-10 ENCOUNTER — Ambulatory Visit
Admission: RE | Admit: 2023-05-10 | Discharge: 2023-05-10 | Disposition: A | Discharge status: Home / Self Care | Payer: Managed Care, Other (non HMO) | Source: Ambulatory Visit | Attending: Family Medicine | Admitting: Family Medicine

## 2023-05-10 VITALS — BP 113/76 | HR 76 | Temp 98.6°F | Resp 20

## 2023-05-10 DIAGNOSIS — S2241XD Multiple fractures of ribs, right side, subsequent encounter for fracture with routine healing: Secondary | ICD-10-CM | POA: Diagnosis not present

## 2023-05-10 MED ORDER — TIZANIDINE HCL 4 MG PO CAPS: 4.0000 mg | ORAL_CAPSULE | Freq: Three times a day (TID) | ORAL | 0 refills | Status: AC | PRN

## 2023-05-10 MED ORDER — OXYCODONE-ACETAMINOPHEN 5-325 MG PO TABS
1.0000 | ORAL_TABLET | Freq: Two times a day (BID) | ORAL | 0 refills | Status: AC | PRN
Start: 2023-05-10 — End: ?
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
## 2023-06-13 IMAGING — DX DG CHEST 1V PORT
1 series · 1 of 1 positions shown · non-contrast
Comparison: PA and lateral 12/30/2014

CLINICAL DATA: Vomiting and chest pain.

EXAM:
PORTABLE CHEST 1 VIEW

[chest ap]
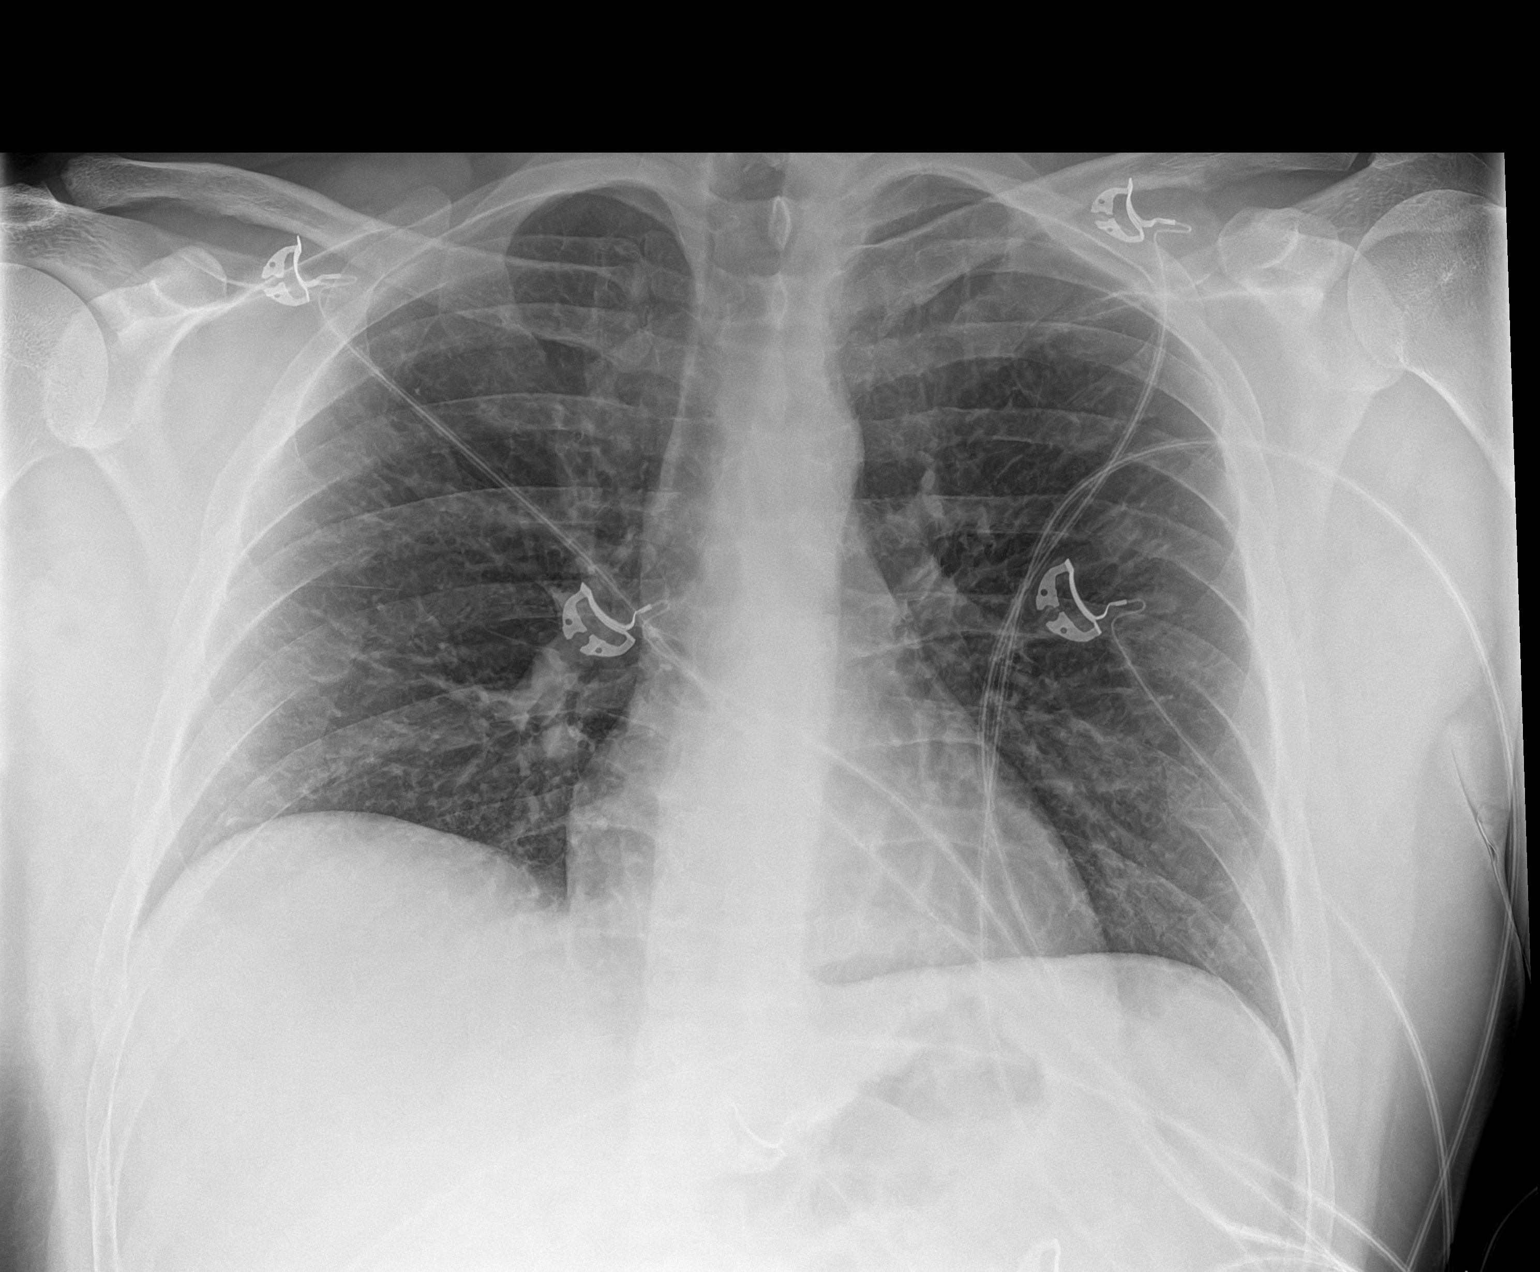

[1 of 1 positions shown; findings below may reference images not displayed]

FINDINGS: The heart size and mediastinal contours are within normal limits.
Both lungs are clear. The visualized skeletal structures are
unremarkable.
IMPRESSION: No active disease.  Stable chest.

## 2023-07-05 ENCOUNTER — Ambulatory Visit
Admission: RE | Admit: 2023-07-05 | Discharge: 2023-07-05 | Disposition: A | Discharge status: Home / Self Care | Payer: Managed Care, Other (non HMO) | Source: Ambulatory Visit | Attending: Family Medicine | Admitting: Family Medicine

## 2023-07-05 ENCOUNTER — Ambulatory Visit: Payer: Managed Care, Other (non HMO)

## 2023-07-05 VITALS — BP 135/78 | HR 73 | Temp 97.9°F | Resp 20

## 2023-07-05 DIAGNOSIS — R0781 Pleurodynia: Secondary | ICD-10-CM

## 2023-07-05 MED ORDER — TIZANIDINE HCL 4 MG PO CAPS: 4.0000 mg | ORAL_CAPSULE | Freq: Three times a day (TID) | ORAL | 0 refills | Status: AC | PRN

## 2023-07-05 MED ORDER — NAPROXEN 500 MG PO TABS: 500.0000 mg | ORAL_TABLET | Freq: Two times a day (BID) | ORAL | 0 refills | Status: AC | PRN

## 2023-07-05 NOTE — ED Triage Notes (Signed)
Pt reports he has right side rib pain and coughing x 1 day   Hurts to take a deep breath

## 2023-07-05 NOTE — Discharge Instructions (Signed)
I have sent over a muscle relaxer and an anti-inflammatory pain medication to help with your symptoms.  You may also use heat and muscle rubs to the area.  We will call if anything comes back abnormal on your x-ray.
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
## 2024-01-04 ENCOUNTER — Emergency Department (HOSPITAL_COMMUNITY)
Admission: EM | Admit: 2024-01-04 | Discharge: 2024-01-04 | Discharge status: Against Medical Advice | Attending: Emergency Medicine | Admitting: Emergency Medicine

## 2024-01-04 ENCOUNTER — Encounter (HOSPITAL_COMMUNITY): Payer: Self-pay | Admitting: Emergency Medicine

## 2024-01-04 ENCOUNTER — Emergency Department (HOSPITAL_COMMUNITY)

## 2024-01-04 ENCOUNTER — Other Ambulatory Visit: Payer: Self-pay

## 2024-01-04 DIAGNOSIS — M545 Low back pain, unspecified: Secondary | ICD-10-CM | POA: Insufficient documentation

## 2024-01-04 DIAGNOSIS — Z5329 Procedure and treatment not carried out because of patient's decision for other reasons: Secondary | ICD-10-CM | POA: Diagnosis not present

## 2024-01-04 DIAGNOSIS — R109 Unspecified abdominal pain: Secondary | ICD-10-CM | POA: Insufficient documentation

## 2024-01-04 LAB — CBC WITH DIFFERENTIAL/PLATELET
Abs Immature Granulocytes: 0.02 10*3/uL (ref 0.00–0.07)
Basophils Absolute: 0.1 10*3/uL (ref 0.0–0.1)
Basophils Relative: 1 %
Eosinophils Absolute: 0.1 10*3/uL (ref 0.0–0.5)
Eosinophils Relative: 1 %
HCT: 44.5 % (ref 39.0–52.0)
Hemoglobin: 15.3 g/dL (ref 13.0–17.0)
Immature Granulocytes: 0 %
Lymphocytes Relative: 22 %
Lymphs Abs: 2.1 10*3/uL (ref 0.7–4.0)
MCH: 29.9 pg (ref 26.0–34.0)
MCHC: 34.4 g/dL (ref 30.0–36.0)
MCV: 87.1 fL (ref 80.0–100.0)
Monocytes Absolute: 0.6 10*3/uL (ref 0.1–1.0)
Monocytes Relative: 7 %
Neutro Abs: 6.6 10*3/uL (ref 1.7–7.7)
Neutrophils Relative %: 69 %
Platelets: 231 10*3/uL (ref 150–400)
RBC: 5.11 MIL/uL (ref 4.22–5.81)
RDW: 13.2 % (ref 11.5–15.5)
WBC: 9.5 10*3/uL (ref 4.0–10.5)
nRBC: 0 % (ref 0.0–0.2)

## 2024-01-04 LAB — LIPASE, BLOOD: Lipase: 31 U/L (ref 11–51)

## 2024-01-04 LAB — COMPREHENSIVE METABOLIC PANEL WITH GFR
ALT: 49 U/L — ABNORMAL HIGH (ref 0–44)
AST: 37 U/L (ref 15–41)
Albumin: 3.9 g/dL (ref 3.5–5.0)
Alkaline Phosphatase: 73 U/L (ref 38–126)
Anion gap: 7 (ref 5–15)
BUN: 12 mg/dL (ref 6–20)
CO2: 25 mmol/L (ref 22–32)
Calcium: 8.8 mg/dL — ABNORMAL LOW (ref 8.9–10.3)
Chloride: 102 mmol/L (ref 98–111)
Creatinine, Ser: 1.04 mg/dL (ref 0.61–1.24)
GFR, Estimated: >60 mL/min (ref >60)
Glucose, Bld: 117 mg/dL — ABNORMAL HIGH (ref 70–99)
Potassium: 3.7 mmol/L (ref 3.5–5.1)
Sodium: 134 mmol/L — ABNORMAL LOW (ref 135–145)
Total Bilirubin: 0.5 mg/dL (ref 0.0–1.2)
Total Protein: 6.7 g/dL (ref 6.5–8.1)

## 2024-01-04 LAB — URINALYSIS, ROUTINE W REFLEX MICROSCOPIC
Bilirubin Urine: NEGATIVE
Glucose, UA: NEGATIVE mg/dL
Hgb urine dipstick: NEGATIVE
Ketones, ur: NEGATIVE mg/dL
Leukocytes,Ua: NEGATIVE
Nitrite: NEGATIVE
Protein, ur: NEGATIVE mg/dL
Specific Gravity, Urine: 1.009 (ref 1.005–1.030)
pH: 7 (ref 5.0–8.0)

## 2024-01-04 MED ORDER — METHOCARBAMOL 500 MG PO TABS
500.0000 mg | ORAL_TABLET | Freq: Once | ORAL | Status: DC
  Filled 2024-01-04: qty 1

## 2024-01-04 MED ORDER — HYDROCODONE-ACETAMINOPHEN 5-325 MG PO TABS
1.0000 | ORAL_TABLET | Freq: Once | ORAL | Status: DC
  Filled 2024-01-04: qty 1

## 2024-01-04 MED ORDER — LIDOCAINE 5 % EX PTCH
1.0000 | MEDICATED_PATCH | CUTANEOUS | Status: DC
  Filled 2024-01-04: qty 1

## 2024-01-04 MED ORDER — KETOROLAC TROMETHAMINE 15 MG/ML IJ SOLN
15.0000 mg | Freq: Once | INTRAMUSCULAR | Status: AC
  Administered 2024-01-04: 15 mg via INTRAVENOUS
  Filled 2024-01-04: qty 1

## 2024-01-04 NOTE — ED Provider Notes (Signed)
 [place] EMERGENCY DEPARTMENT AT [facility] Provider Note   CSN: [id number] Arrival date & time: 01/04/24  0915     History  Chief Complaint  Patient presents with   Flank Pain    SULEYMAN EHRMAN is a 38 y.o. male.  He has no significant PMH.  Presents the ER for left flank pain that started yesterday with some mild abdominal pain and today, while driving the car he sneezed and felt a severe sharp tearing pain in the area just below his left rib cage.  Since then pain has been more severe and constant with any cough, movement and breathing.  Denies chest pain or shortness of breath.  Denies injury or trauma, no heavy lifting yesterday, no urinary symptoms.   Flank Pain       Home Medications Prior to Admission medications   Medication Sig Start Date End Date Taking? Authorizing Provider  acetaminophen  (TYLENOL ) 500 MG tablet Take 1,000 mg by mouth every 6 (six) hours as needed for mild pain or moderate pain.    [provider]  albuterol  (VENTOLIN  HFA) 108 (90 Base) MCG/ACT inhaler Inhale 1-2 puffs into the lungs every 6 (six) hours as needed for wheezing or shortness of breath. 07/01/22   Robinson, John K, PA-C  benzonatate  (TESSALON ) 200 MG capsule Take 1 capsule (200 mg total) by mouth 3 (three) times daily as needed for cough. 12/30/14   Hardie Leyland, NP  fluticasone  (FLONASE ) 50 MCG/ACT nasal spray Place 2 sprays into both nostrils daily. 06/30/22   Leath-Warren, Belen Bowers, NP  guaiFENesin -codeine  (ROBITUSSIN AC) 100-10 MG/5ML syrup Take 5 mLs by mouth 3 (three) times daily as needed for cough. 12/30/14   Hardie Leyland, NP  ibuprofen  (ADVIL ,MOTRIN ) 200 MG tablet Take 400 mg by mouth every 6 (six) hours as needed for mild pain or moderate pain.    [provider]  lidocaine  (XYLOCAINE ) 2 % solution Use as directed 5 mLs in the mouth or throat every 6 (six) hours as needed for mouth pain. 08/16/22   Leath-Warren, Belen Bowers, NP  naproxen   (NAPROSYN ) 500 MG tablet Take 1 tablet (500 mg total) by mouth 2 (two) times daily as needed. 07/05/23   Corbin Dess, PA-C  ondansetron  (ZOFRAN ) 4 MG tablet Take 1 tablet (4 mg total) by mouth every 6 (six) hours. As needed for nausea vomiting 10/01/21   Triplett, Tammy, PA-C  ondansetron  (ZOFRAN -ODT) 4 MG disintegrating tablet Take 1 tablet (4 mg total) by mouth every 8 (eight) hours as needed for nausea or vomiting. 08/16/22   Leath-Warren, Belen Bowers, NP  oseltamivir  (TAMIFLU ) 75 MG capsule Take 1 capsule (75 mg total) by mouth every 12 (twelve) hours. 08/16/22   Leath-Warren, Belen Bowers, NP  oxyCODONE -acetaminophen  (PERCOCET/ROXICET) 5-325 MG per tablet Take 1 tablet by mouth every 4 (four) hours as needed for severe pain. 07/21/13   Alissa April, MD  oxyCODONE -acetaminophen  (PERCOCET/ROXICET) 5-325 MG tablet Take 1 tablet by mouth every 6 (six) hours as needed for severe pain. 05/04/23   Zelaya, Oscar A, PA-C  oxyCODONE -acetaminophen  (PERCOCET/ROXICET) 5-325 MG tablet Take 1 tablet by mouth 2 (two) times daily as needed for severe pain. 05/10/23   Corbin Dess, PA-C  pantoprazole  (PROTONIX ) 40 MG tablet Take 1 tablet (40 mg total) by mouth daily. 10/01/21   Triplett, Tammy, PA-C  predniSONE  (DELTASONE ) 20 MG tablet Take 2 tablets (40 mg total) by mouth daily with breakfast. 08/05/22   Corbin Dess, PA-C  promethazine  (  PHENERGAN ) 25 MG tablet Take 1 tablet (25 mg total) by mouth every 6 (six) hours as needed (headache). 06/26/16   Ballard Bongo, MD  promethazine -dextromethorphan (PROMETHAZINE -DM) 6.25-15 MG/5ML syrup Take 5 mLs by mouth 4 (four) times daily as needed for cough. 06/30/22   Leath-Warren, Belen Bowers, NP  tiZANidine  (ZANAFLEX ) 4 MG capsule Take 1 capsule (4 mg total) by mouth 3 (three) times daily as needed for muscle spasms. Do not drink alcohol or while taking this medication.  May cause drowsiness. 05/10/23   Corbin Dess, PA-C  tiZANidine   (ZANAFLEX ) 4 MG capsule Take 1 capsule (4 mg total) by mouth 3 (three) times daily as needed for muscle spasms. Do not drink alcohol or drive while taking this medication.  May cause drowsiness. 07/05/23   Corbin Dess, PA-C      Allergies    Patient has no known allergies.    Review of Systems   Review of Systems  Genitourinary:  Positive for flank pain.    Physical Exam Updated Vital Signs BP 128/83   Pulse 70   Temp 98.5 F (36.9 C)   Resp 18   Ht 5\' 11"  (1.803 m)   Wt 93 kg   SpO2 98%   BMI 28.59 kg/m  Physical Exam Vitals and nursing note reviewed.  Constitutional:      General: He is not in acute distress.    Appearance: He is well-developed.  HENT:     Head: Normocephalic and atraumatic.     Mouth/Throat:     Mouth: Mucous membranes are moist.  Eyes:     Extraocular Movements: Extraocular movements intact.     Conjunctiva/sclera: Conjunctivae normal.     Pupils: Pupils are equal, round, and reactive to light.  Cardiovascular:     Rate and Rhythm: Normal rate and regular rhythm.     Heart sounds: No murmur heard. Pulmonary:     Effort: Pulmonary effort is normal. No respiratory distress.     Breath sounds: Normal breath sounds.  Abdominal:     Palpations: Abdomen is soft.     Tenderness: There is no abdominal tenderness.  Musculoskeletal:        General: No swelling.     Cervical back: Neck supple.     Comments: Redness to left lumbar area and left flank.  Pain with rotation of torso  Skin:    General: Skin is warm and dry.     Capillary Refill: Capillary refill takes less than 2 seconds.     Findings: No bruising, erythema or rash.  Neurological:     General: No focal deficit present.     Mental Status: He is alert and oriented to person, place, and time.     Sensory: No sensory deficit.     Motor: No weakness.     Gait: Gait normal.  Psychiatric:        Mood and Affect: Mood normal.     ED Results / Procedures / Treatments    Labs (all labs ordered are listed, but only abnormal results are displayed) Labs Reviewed  CBC WITH DIFFERENTIAL/PLATELET  COMPREHENSIVE METABOLIC PANEL WITH GFR  LIPASE, BLOOD  URINALYSIS, ROUTINE W REFLEX MICROSCOPIC    EKG None  Radiology No results found.  Procedures Procedures    Medications Ordered in ED Medications - No data to display  ED Course/ Medical Decision Making/ A&P  Medical Decision Making Differential diagnosis includes but limited to muscle strain, contusion, UTI, kidney stone, herpes zoster, disc disease, HNP, other  Course: Patient here for left low back pain, was soreness yesterday, today after sneezing is become much more severe.  It is very reproducible in the left lower back and left lateral flank area.  There is no chest pain, no shortness of breath, no abdominal tenderness on exam, no masses palpated on his exam.  He is not short of breath tachycardic or tachypneic, no leg swelling or tenderness to suggest DVT.  No history of PE, by car PERC criteria, he is negative on all of these, no need for workup for PE for his pain, feel his pain with respiration is likely due to muscle strain.  CT ordered to rule out possible kidney stone or other intra-abdominal process and this was normal.  Labs are overall reassuring he did not have UTI to suggest pyelonephritis.  I discussed with patient we are still waiting on his urinalysis and I was planning to go reevaluate and order more pain location if indicated, will discharge with NSAIDs and muscle relaxers.  Patient unfortunately stated he needed to leave and left prior to receiving discharge instructions were prescriptions.  Amount and/or Complexity of Data Reviewed Labs: ordered. Radiology: ordered.  Risk Prescription drug management.           Final Clinical Impression(s) / ED Diagnoses Final diagnoses:  None    Rx / DC Orders ED Discharge Orders     None          Joshua Nieves 01/04/24 1923    Ninetta Basket, MD 01/07/24 1352

## 2024-01-04 NOTE — ED Triage Notes (Signed)
 Pt ambulatory to room 7 with c/o left flank pain.  Pt states yesterday noted a dull achy pain, states he sneezed really hard and felt like something was tearing.  Has continued to have sharp pain since that time that increased with deep inspiration, cough and movement.  Pt denies radiation of pain.

## 2024-03-31 ENCOUNTER — Emergency Department (HOSPITAL_COMMUNITY)
Admission: EM | Admit: 2024-03-31 | Discharge: 2024-04-01 | Disposition: A | Discharge status: Home / Self Care | Attending: Emergency Medicine | Admitting: Emergency Medicine

## 2024-03-31 ENCOUNTER — Other Ambulatory Visit: Payer: Self-pay

## 2024-03-31 ENCOUNTER — Encounter (HOSPITAL_COMMUNITY): Payer: Self-pay

## 2024-03-31 DIAGNOSIS — Z7951 Long term (current) use of inhaled steroids: Secondary | ICD-10-CM | POA: Diagnosis not present

## 2024-03-31 DIAGNOSIS — J45909 Unspecified asthma, uncomplicated: Secondary | ICD-10-CM | POA: Insufficient documentation

## 2024-03-31 DIAGNOSIS — Z7952 Long term (current) use of systemic steroids: Secondary | ICD-10-CM | POA: Insufficient documentation

## 2024-03-31 DIAGNOSIS — L509 Urticaria, unspecified: Secondary | ICD-10-CM

## 2024-03-31 DIAGNOSIS — L299 Pruritus, unspecified: Secondary | ICD-10-CM | POA: Insufficient documentation

## 2024-03-31 NOTE — ED Triage Notes (Signed)
 Pt with allergic reaction that started as a few small hives yesterday but now has gotten worse and all over body. Pt states he felt like his tongue was swelling earlier, unknown allergen. Pt took benadryl  PTA.

## 2024-04-01 MED ORDER — PREDNISONE 10 MG PO TABS: 20.0000 mg | ORAL_TABLET | Freq: Two times a day (BID) | ORAL | 0 refills | Status: AC

## 2024-04-01 MED ORDER — DIPHENHYDRAMINE HCL 50 MG/ML IJ SOLN
25.0000 mg | Freq: Once | INTRAMUSCULAR | Status: AC
  Administered 2024-04-01 (×2): 25 mg via INTRAVENOUS
  Filled 2024-04-01: qty 1

## 2024-04-01 MED ORDER — FAMOTIDINE IN NACL 20-0.9 MG/50ML-% IV SOLN
20.0000 mg | Freq: Once | INTRAVENOUS | Status: AC
  Administered 2024-04-01 (×2): 20 mg via INTRAVENOUS
  Filled 2024-04-01: qty 50

## 2024-04-01 MED ORDER — METHYLPREDNISOLONE SODIUM SUCC 125 MG IJ SOLR
125.0000 mg | Freq: Once | INTRAMUSCULAR | Status: AC
  Administered 2024-04-01 (×2): 125 mg via INTRAVENOUS
  Filled 2024-04-01: qty 2

## 2024-04-01 NOTE — ED Provider Notes (Signed)
 [place] EMERGENCY DEPARTMENT AT [facility] Provider Note   CSN: [id number] Arrival date & time: 03/31/24  2345     Patient presents with: No chief complaint on file.   Logan Lane is a 38 y.o. male.   Patient is a 37 year old male with history of asthma.  Patient presenting today for evaluation of rash and itching.  He started yesterday with these symptoms, then they worsened this evening.  He now has hives all over his torso, neck, and extremities.  He also describes some itchiness of his throat and feels as though his tongue is swollen.  No difficulty breathing or swallowing.  He denies any new contacts or exposures.  He did take Benadryl  earlier this evening with little relief.       Prior to Admission medications   Medication Sig Start Date End Date Taking? Authorizing Provider  acetaminophen  (TYLENOL ) 500 MG tablet Take 1,000 mg by mouth every 6 (six) hours as needed for mild pain or moderate pain.    [provider]  albuterol  (VENTOLIN  HFA) 108 (90 Base) MCG/ACT inhaler Inhale 1-2 puffs into the lungs every 6 (six) hours as needed for wheezing or shortness of breath. 07/01/22   Robinson, John K, PA-C  benzonatate  (TESSALON ) 200 MG capsule Take 1 capsule (200 mg total) by mouth 3 (three) times daily as needed for cough. 12/30/14   Jamelle Lorrayne HERO, NP  fluticasone  (FLONASE ) 50 MCG/ACT nasal spray Place 2 sprays into both nostrils daily. 06/30/22   Leath-Warren, Etta JINNY, NP  guaiFENesin -codeine  (ROBITUSSIN AC) 100-10 MG/5ML syrup Take 5 mLs by mouth 3 (three) times daily as needed for cough. 12/30/14   Jamelle Lorrayne HERO, NP  ibuprofen  (ADVIL ,MOTRIN ) 200 MG tablet Take 400 mg by mouth every 6 (six) hours as needed for mild pain or moderate pain.    [provider]  lidocaine  (XYLOCAINE ) 2 % solution Use as directed 5 mLs in the mouth or throat every 6 (six) hours as needed for mouth pain. 08/16/22   Leath-Warren, Etta JINNY, NP  naproxen  (NAPROSYN ) 500  MG tablet Take 1 tablet (500 mg total) by mouth 2 (two) times daily as needed. 07/05/23   Stuart Vernell Norris, PA-C  ondansetron  (ZOFRAN ) 4 MG tablet Take 1 tablet (4 mg total) by mouth every 6 (six) hours. As needed for nausea vomiting 10/01/21   Triplett, Tammy, PA-C  ondansetron  (ZOFRAN -ODT) 4 MG disintegrating tablet Take 1 tablet (4 mg total) by mouth every 8 (eight) hours as needed for nausea or vomiting. 08/16/22   Leath-Warren, Etta JINNY, NP  oseltamivir  (TAMIFLU ) 75 MG capsule Take 1 capsule (75 mg total) by mouth every 12 (twelve) hours. 08/16/22   Leath-Warren, Etta JINNY, NP  oxyCODONE -acetaminophen  (PERCOCET/ROXICET) 5-325 MG per tablet Take 1 tablet by mouth every 4 (four) hours as needed for severe pain. 07/21/13   Raford Lenis, MD  oxyCODONE -acetaminophen  (PERCOCET/ROXICET) 5-325 MG tablet Take 1 tablet by mouth every 6 (six) hours as needed for severe pain. 05/04/23   Zelaya, Oscar A, PA-C  oxyCODONE -acetaminophen  (PERCOCET/ROXICET) 5-325 MG tablet Take 1 tablet by mouth 2 (two) times daily as needed for severe pain. 05/10/23   Stuart Vernell Norris, PA-C  pantoprazole  (PROTONIX ) 40 MG tablet Take 1 tablet (40 mg total) by mouth daily. 10/01/21   Triplett, Tammy, PA-C  predniSONE  (DELTASONE ) 20 MG tablet Take 2 tablets (40 mg total) by mouth daily with breakfast. 08/05/22   Stuart Vernell Norris, PA-C  promethazine  (PHENERGAN ) 25 MG tablet Take 1  tablet (25 mg total) by mouth every 6 (six) hours as needed (headache). 06/26/16   Haze Lonni PARAS, MD  promethazine -dextromethorphan (PROMETHAZINE -DM) 6.25-15 MG/5ML syrup Take 5 mLs by mouth 4 (four) times daily as needed for cough. 06/30/22   Leath-Warren, Etta PARAS, NP  tiZANidine  (ZANAFLEX ) 4 MG capsule Take 1 capsule (4 mg total) by mouth 3 (three) times daily as needed for muscle spasms. Do not drink alcohol or while taking this medication.  May cause drowsiness. 05/10/23   Stuart Vernell Norris, PA-C  tiZANidine  (ZANAFLEX ) 4 MG  capsule Take 1 capsule (4 mg total) by mouth 3 (three) times daily as needed for muscle spasms. Do not drink alcohol or drive while taking this medication.  May cause drowsiness. 07/05/23   Stuart Vernell Norris, PA-C    Allergies: Patient has no known allergies.    Review of Systems  All other systems reviewed and are negative.   Updated Vital Signs BP 122/88 (BP Location: Left Arm)   Pulse 72   Temp 98.4 F (36.9 C) (Oral)   Resp 18   SpO2 97%   Physical Exam Vitals and nursing note reviewed.  Constitutional:      General: He is not in acute distress.    Appearance: He is well-developed. He is not diaphoretic.  HENT:     Head: Normocephalic and atraumatic.  Cardiovascular:     Rate and Rhythm: Normal rate and regular rhythm.     Heart sounds: No murmur heard.    No friction rub.  Pulmonary:     Effort: Pulmonary effort is normal. No respiratory distress.     Breath sounds: Normal breath sounds. No wheezing or rales.  Abdominal:     General: Bowel sounds are normal. There is no distension.     Palpations: Abdomen is soft.     Tenderness: There is no abdominal tenderness.  Musculoskeletal:        General: Normal range of motion.     Cervical back: Normal range of motion and neck supple.  Skin:    General: Skin is warm and dry.     Findings: Rash present.     Comments: There is an urticarial rash noted to the torso, neck, and extremities.  Neurological:     Mental Status: He is alert and oriented to person, place, and time.     Coordination: Coordination normal.     (all labs ordered are listed, but only abnormal results are displayed) Labs Reviewed - No data to display  EKG: None  Radiology: No results found.   Procedures   Medications Ordered in the ED  diphenhydrAMINE  (BENADRYL ) injection 25 mg (has no administration in time range)  methylPREDNISolone  sodium succinate (SOLU-MEDROL ) 125 mg/2 mL injection 125 mg (has no administration in time range)   famotidine  (PEPCID ) IVPB 20 mg premix (has no administration in time range)                                    Medical Decision Making Risk Prescription drug management.   Patient is a 38 year old male presenting with urticaria as described in the HPI.  Patient arrives here with stable vital signs and is afebrile.  There is no stridor or respiratory distress.  Patient has received IV Solu-Medrol  along with Benadryl  and Pepcid  and rash is markedly improved.  I feel as though patient can safely be discharged with prednisone  and Benadryl .  Cause of  his allergic reaction is unclear.     Final diagnoses:  None    ED Discharge Orders     None          Geroldine Berg, MD 04/01/24 0201

## 2024-04-01 NOTE — Discharge Instructions (Signed)
 Begin taking prednisone  as prescribed.  Begin taking Benadryl  25 mg 3 times daily for the next several days.  Return to the emergency department if you develop difficulty breathing, throat swelling, or for other new and concerning symptoms.

## 2024-04-01 NOTE — ED Notes (Signed)
 Patient states he is feeling better. States the itching has subsides some. Redness over chest is almost cleared completely.

## 2024-08-21 ENCOUNTER — Ambulatory Visit
Admission: EM | Admit: 2024-08-21 | Discharge: 2024-08-21 | Disposition: A | Discharge status: Home / Self Care | Payer: Self-pay | Attending: Nurse Practitioner | Admitting: Nurse Practitioner

## 2024-08-21 ENCOUNTER — Other Ambulatory Visit: Payer: Self-pay

## 2024-08-21 ENCOUNTER — Encounter: Payer: Self-pay | Admitting: Emergency Medicine

## 2024-08-21 DIAGNOSIS — J0100 Acute maxillary sinusitis, unspecified: Secondary | ICD-10-CM

## 2024-08-21 MED ORDER — AMOXICILLIN-POT CLAVULANATE 875-125 MG PO TABS: 1.0000 | ORAL_TABLET | Freq: Two times a day (BID) | ORAL | 0 refills | Status: AC

## 2024-08-21 NOTE — ED Provider Notes (Signed)
 " [facility]    CSN: [id number] Arrival date & time: 08/21/24  0801      History   Chief Complaint Chief Complaint  Patient presents with   Nasal Congestion    HPI Logan Lane is a 39 y.o. male.   HPI   Patient presents with a 2-week history of cough, nasal congestion, sinus pressure, and intermittent shortness of breath.  Patient denies fever, chills, headache, ear pain, ear drainage, wheezing, difficulty breathing, abdominal pain, nausea, vomiting, diarrhea, or rash.  Patient denies underlying history of seasonal allergies.  States that he is a current smoker.  So far, states he has been taking over-the-counter cough medications such as DayQuil and NyQuil with minimal relief.  Patient denies history of seasonal allergies.  History reviewed. No pertinent past medical history.  Patient Active Problem List   Diagnosis Date Noted   Head injury 12/02/2019    History reviewed. No pertinent surgical history.     Home Medications    Prior to Admission medications  Medication Sig Start Date End Date Taking? Authorizing Provider  acetaminophen  (TYLENOL ) 500 MG tablet Take 1,000 mg by mouth every 6 (six) hours as needed for mild pain or moderate pain.    [provider]  albuterol  (VENTOLIN  HFA) 108 (90 Base) MCG/ACT inhaler Inhale 1-2 puffs into the lungs every 6 (six) hours as needed for wheezing or shortness of breath. 07/01/22   Robinson, John K, PA-C  benzonatate  (TESSALON ) 200 MG capsule Take 1 capsule (200 mg total) by mouth 3 (three) times daily as needed for cough. 12/30/14   Jamelle Lorrayne HERO, NP  fluticasone  (FLONASE ) 50 MCG/ACT nasal spray Place 2 sprays into both nostrils daily. 06/30/22   Leath-Warren, Etta JINNY, NP  guaiFENesin -codeine  (ROBITUSSIN AC) 100-10 MG/5ML syrup Take 5 mLs by mouth 3 (three) times daily as needed for cough. 12/30/14   Jamelle Lorrayne HERO, NP  ibuprofen  (ADVIL ,MOTRIN ) 200 MG tablet Take 400 mg by mouth every 6 (six) hours as  needed for mild pain or moderate pain.    [provider]  lidocaine  (XYLOCAINE ) 2 % solution Use as directed 5 mLs in the mouth or throat every 6 (six) hours as needed for mouth pain. 08/16/22   Leath-Warren, Etta JINNY, NP  naproxen  (NAPROSYN ) 500 MG tablet Take 1 tablet (500 mg total) by mouth 2 (two) times daily as needed. 07/05/23   Stuart Vernell Norris, PA-C  ondansetron  (ZOFRAN ) 4 MG tablet Take 1 tablet (4 mg total) by mouth every 6 (six) hours. As needed for nausea vomiting 10/01/21   Triplett, Tammy, PA-C  ondansetron  (ZOFRAN -ODT) 4 MG disintegrating tablet Take 1 tablet (4 mg total) by mouth every 8 (eight) hours as needed for nausea or vomiting. 08/16/22   Leath-Warren, Etta JINNY, NP  oseltamivir  (TAMIFLU ) 75 MG capsule Take 1 capsule (75 mg total) by mouth every 12 (twelve) hours. 08/16/22   Leath-Warren, Etta JINNY, NP  oxyCODONE -acetaminophen  (PERCOCET/ROXICET) 5-325 MG per tablet Take 1 tablet by mouth every 4 (four) hours as needed for severe pain. 07/21/13   Raford Lenis, MD  oxyCODONE -acetaminophen  (PERCOCET/ROXICET) 5-325 MG tablet Take 1 tablet by mouth every 6 (six) hours as needed for severe pain. 05/04/23   Zelaya, Oscar A, PA-C  oxyCODONE -acetaminophen  (PERCOCET/ROXICET) 5-325 MG tablet Take 1 tablet by mouth 2 (two) times daily as needed for severe pain. 05/10/23   Stuart Vernell Norris, PA-C  pantoprazole  (PROTONIX ) 40 MG tablet Take 1 tablet (40 mg total) by mouth daily. 10/01/21  Triplett, Tammy, PA-C  predniSONE  (DELTASONE ) 10 MG tablet Take 2 tablets (20 mg total) by mouth 2 (two) times daily. 04/01/24   Geroldine Berg, MD  promethazine  (PHENERGAN ) 25 MG tablet Take 1 tablet (25 mg total) by mouth every 6 (six) hours as needed (headache). 06/26/16   Haze Lonni PARAS, MD  promethazine -dextromethorphan (PROMETHAZINE -DM) 6.25-15 MG/5ML syrup Take 5 mLs by mouth 4 (four) times daily as needed for cough. 06/30/22   Leath-Warren, Etta PARAS, NP  tiZANidine  (ZANAFLEX )  4 MG capsule Take 1 capsule (4 mg total) by mouth 3 (three) times daily as needed for muscle spasms. Do not drink alcohol or while taking this medication.  May cause drowsiness. 05/10/23   Stuart Vernell Norris, PA-C  tiZANidine  (ZANAFLEX ) 4 MG capsule Take 1 capsule (4 mg total) by mouth 3 (three) times daily as needed for muscle spasms. Do not drink alcohol or drive while taking this medication.  May cause drowsiness. 07/05/23   Stuart Vernell Norris, PA-C    Family History History reviewed. No pertinent family history.  Social History Social History[1]   Allergies   Patient has no known allergies.   Review of Systems Review of Systems Per HPI  Physical Exam Triage Vital Signs ED Triage Vitals  Encounter Vitals Group     BP 08/21/24 0820 133/84     Girls Systolic BP Percentile --      Girls Diastolic BP Percentile --      Boys Systolic BP Percentile --      Boys Diastolic BP Percentile --      Pulse Rate 08/21/24 0820 71     Resp 08/21/24 0820 20     Temp 08/21/24 0820 97.8 F (36.6 C)     Temp Source 08/21/24 0820 Oral     SpO2 08/21/24 0820 97 %     Weight --      Height --      Head Circumference --      Peak Flow --      Pain Score 08/21/24 0821 0     Pain Loc --      Pain Education --      Exclude from Growth Chart --    No data found.  Updated Vital Signs BP 133/84 (BP Location: Right Arm)   Pulse 71   Temp 97.8 F (36.6 C) (Oral)   Resp 20   SpO2 97%   Visual Acuity Right Eye Distance:   Left Eye Distance:   Bilateral Distance:    Right Eye Near:   Left Eye Near:    Bilateral Near:     Physical Exam Vitals and nursing note reviewed.  Constitutional:      General: He is not in acute distress.    Appearance: Normal appearance.  HENT:     Head: Normocephalic.     Right Ear: Tympanic membrane, ear canal and external ear normal.     Left Ear: Tympanic membrane, ear canal and external ear normal.     Nose: Congestion present.     Right  Turbinates: Enlarged and swollen.     Left Turbinates: Enlarged and swollen.     Right Sinus: Maxillary sinus tenderness present.     Left Sinus: Maxillary sinus tenderness present.     Mouth/Throat:     Lips: Pink.     Mouth: Mucous membranes are moist.     Pharynx: Postnasal drip present. No pharyngeal swelling, oropharyngeal exudate, posterior oropharyngeal erythema or uvula swelling.  Eyes:  Extraocular Movements: Extraocular movements intact.     Conjunctiva/sclera: Conjunctivae normal.     Pupils: Pupils are equal, round, and reactive to light.  Cardiovascular:     Rate and Rhythm: Normal rate and regular rhythm.     Pulses: Normal pulses.     Heart sounds: Normal heart sounds.  Pulmonary:     Effort: Pulmonary effort is normal.     Breath sounds: Normal breath sounds.  Abdominal:     General: Bowel sounds are normal.     Palpations: Abdomen is soft.  Musculoskeletal:     Cervical back: Normal range of motion.  Skin:    General: Skin is warm and dry.  Neurological:     General: No focal deficit present.     Mental Status: He is alert and oriented to person, place, and time.  Psychiatric:        Mood and Affect: Mood normal.        Behavior: Behavior normal.      UC Treatments / Results  Labs (all labs ordered are listed, but only abnormal results are displayed) Labs Reviewed - No data to display  EKG   Radiology No results found.  Procedures Procedures (including critical care time)  Medications Ordered in UC Medications - No data to display  Initial Impression / Assessment and Plan / UC Course  I have reviewed the triage vital signs and the nursing notes.  Pertinent labs & imaging results that were available during my care of the patient were reviewed by me and considered in my medical decision making (see chart for details).  On exam, the patient's lung sounds are clear throughout, room air sats are at 97%.  No indication for imaging at this time.   Patient with sinus pressure and nasal congestion for the past 2 weeks.  He does have some maxillary tenderness noted on exam.  Will treat for acute sinusitis with bacterial etiology with Augmentin 875/125 mg.  Patient reports he will use an over-the-counter nasal spray for the ongoing nasal congestion.  Supportive care recommendations were provided and discussed with the patient to include fluids, rest, over-the-counter analgesics, normal saline nasal spray, and use of a humidifier during sleep.  Discussed indications with the patient regarding follow-up.  Patient was in agreement with this plan of care and verbalizes understanding.  All questions were answered.  Patient stable for discharge.   Final Clinical Impressions(s) / UC Diagnoses   Final diagnoses:  None   Discharge Instructions   None    ED Prescriptions   None    PDMP not reviewed this encounter.     [1]  Social History Tobacco Use   Smoking status: Every Day    Current packs/day: 0.25    Types: Cigarettes   Smokeless tobacco: Never  Vaping Use   Vaping status: Never Used  Substance Use Topics   Alcohol use: Not Currently   Drug use: No     Gilmer Etta PARAS, NP 08/21/24 [phone]  "

## 2024-08-21 NOTE — Discharge Instructions (Signed)
 Take medication as directed.  Continue over-the-counter nasal sprays as you have found to be effective. Increase fluids and get plenty of rest. May take over-the-counter Ibuprofen  or Tylenol  as needed for pain, fever, or general discomfort. Recommend normal saline nasal spray to help with nasal congestion throughout the day. For your cough, it may be helpful to use a humidifier at bedtime during sleep. If symptoms fail to improve with this treatment, follow-up with with your primary care provider or in this clinic for further evaluation. Follow-up as needed.

## 2024-08-21 NOTE — ED Triage Notes (Signed)
 Pt reports nasal congestion x2 weeks. Has tried otc medication with no change in symptoms. Pt reports hard to breathe at times. NAD noted in triage.
